# Patient Record
Sex: Male | Born: 1975 | Race: Black or African American | Hispanic: No | Marital: Married | Smoking: Former smoker
Health system: Southern US, Community
[De-identification: ages and names within clinical notes are randomized; demographics above are authoritative.]

## PROBLEM LIST (undated history)

## (undated) DIAGNOSIS — G8929 Other chronic pain: Secondary | ICD-10-CM

## (undated) DIAGNOSIS — M549 Dorsalgia, unspecified: Secondary | ICD-10-CM

## (undated) HISTORY — PX: KNEE SURGERY: SHX244

## (undated) HISTORY — PX: HEMORROIDECTOMY: SUR656

## (undated) HISTORY — PX: SHOULDER SURGERY: SHX246

## (undated) HISTORY — PX: VASECTOMY: SHX75

## (undated) HISTORY — PX: TONSILLECTOMY: SUR1361

## (undated) HISTORY — PX: OTHER SURGICAL HISTORY: SHX169

---

## 2001-12-04 ENCOUNTER — Emergency Department (HOSPITAL_COMMUNITY): Admission: EM | Admit: 2001-12-04 | Discharge: 2001-12-05 | Payer: Self-pay | Admitting: *Deleted

## 2002-02-24 ENCOUNTER — Encounter: Payer: Self-pay | Admitting: Internal Medicine

## 2002-02-24 ENCOUNTER — Emergency Department (HOSPITAL_COMMUNITY): Admission: EM | Admit: 2002-02-24 | Discharge: 2002-02-24 | Payer: Self-pay | Admitting: Internal Medicine

## 2002-09-18 ENCOUNTER — Emergency Department (HOSPITAL_COMMUNITY): Admission: EM | Admit: 2002-09-18 | Discharge: 2002-09-18 | Payer: Self-pay | Admitting: Emergency Medicine

## 2002-09-18 ENCOUNTER — Encounter: Payer: Self-pay | Admitting: Emergency Medicine

## 2003-02-21 ENCOUNTER — Emergency Department (HOSPITAL_COMMUNITY): Admission: EM | Admit: 2003-02-21 | Discharge: 2003-02-21 | Payer: Self-pay | Admitting: Emergency Medicine

## 2003-05-28 ENCOUNTER — Emergency Department (HOSPITAL_COMMUNITY): Admission: EM | Admit: 2003-05-28 | Discharge: 2003-05-28 | Payer: Self-pay | Admitting: Emergency Medicine

## 2003-07-14 ENCOUNTER — Emergency Department (HOSPITAL_COMMUNITY): Admission: EM | Admit: 2003-07-14 | Discharge: 2003-07-14 | Payer: Self-pay | Admitting: Emergency Medicine

## 2003-11-02 ENCOUNTER — Emergency Department (HOSPITAL_COMMUNITY): Admission: EM | Admit: 2003-11-02 | Discharge: 2003-11-02 | Payer: Self-pay | Admitting: Emergency Medicine

## 2003-12-22 ENCOUNTER — Emergency Department (HOSPITAL_COMMUNITY): Admission: EM | Admit: 2003-12-22 | Discharge: 2003-12-23 | Payer: Self-pay | Admitting: *Deleted

## 2004-04-10 ENCOUNTER — Emergency Department (HOSPITAL_COMMUNITY): Admission: EM | Admit: 2004-04-10 | Discharge: 2004-04-10 | Payer: Self-pay | Admitting: Emergency Medicine

## 2004-06-20 ENCOUNTER — Emergency Department (HOSPITAL_COMMUNITY): Admission: EM | Admit: 2004-06-20 | Discharge: 2004-06-20 | Payer: Self-pay | Admitting: Emergency Medicine

## 2004-07-10 ENCOUNTER — Emergency Department (HOSPITAL_COMMUNITY): Admission: EM | Admit: 2004-07-10 | Discharge: 2004-07-10 | Payer: Self-pay | Admitting: Emergency Medicine

## 2004-11-02 ENCOUNTER — Emergency Department (HOSPITAL_COMMUNITY): Admission: EM | Admit: 2004-11-02 | Discharge: 2004-11-02 | Payer: Self-pay | Admitting: Emergency Medicine

## 2004-12-30 ENCOUNTER — Emergency Department (HOSPITAL_COMMUNITY): Admission: EM | Admit: 2004-12-30 | Discharge: 2004-12-30 | Payer: Self-pay | Admitting: Emergency Medicine

## 2005-04-11 ENCOUNTER — Emergency Department (HOSPITAL_COMMUNITY): Admission: EM | Admit: 2005-04-11 | Discharge: 2005-04-11 | Payer: Self-pay | Admitting: Emergency Medicine

## 2005-04-16 ENCOUNTER — Emergency Department (HOSPITAL_COMMUNITY): Admission: EM | Admit: 2005-04-16 | Discharge: 2005-04-17 | Payer: Self-pay | Admitting: Emergency Medicine

## 2005-06-27 ENCOUNTER — Emergency Department (HOSPITAL_COMMUNITY): Admission: EM | Admit: 2005-06-27 | Discharge: 2005-06-27 | Payer: Self-pay | Admitting: Emergency Medicine

## 2005-11-06 ENCOUNTER — Emergency Department (HOSPITAL_COMMUNITY): Admission: EM | Admit: 2005-11-06 | Discharge: 2005-11-06 | Payer: Self-pay | Admitting: Emergency Medicine

## 2006-04-26 ENCOUNTER — Emergency Department (HOSPITAL_COMMUNITY): Admission: EM | Admit: 2006-04-26 | Discharge: 2006-04-26 | Payer: Self-pay | Admitting: Emergency Medicine

## 2006-05-02 ENCOUNTER — Emergency Department (HOSPITAL_COMMUNITY): Admission: EM | Admit: 2006-05-02 | Discharge: 2006-05-02 | Payer: Self-pay | Admitting: Emergency Medicine

## 2006-07-17 ENCOUNTER — Ambulatory Visit: Payer: Self-pay | Admitting: Orthopedic Surgery

## 2006-07-28 ENCOUNTER — Ambulatory Visit (HOSPITAL_COMMUNITY): Admission: RE | Admit: 2006-07-28 | Discharge: 2006-07-28 | Payer: Self-pay | Admitting: Orthopedic Surgery

## 2006-08-09 ENCOUNTER — Ambulatory Visit: Payer: Self-pay | Admitting: Orthopedic Surgery

## 2006-10-12 ENCOUNTER — Ambulatory Visit: Payer: Self-pay | Admitting: Orthopedic Surgery

## 2006-11-04 ENCOUNTER — Emergency Department (HOSPITAL_COMMUNITY): Admission: EM | Admit: 2006-11-04 | Discharge: 2006-11-04 | Payer: Self-pay | Admitting: *Deleted

## 2006-12-04 ENCOUNTER — Ambulatory Visit: Payer: Self-pay | Admitting: Orthopedic Surgery

## 2006-12-11 ENCOUNTER — Ambulatory Visit (HOSPITAL_COMMUNITY): Admission: RE | Admit: 2006-12-11 | Discharge: 2006-12-11 | Payer: Self-pay | Admitting: Orthopedic Surgery

## 2006-12-18 ENCOUNTER — Ambulatory Visit: Payer: Self-pay | Admitting: Orthopedic Surgery

## 2006-12-25 ENCOUNTER — Emergency Department (HOSPITAL_COMMUNITY): Admission: EM | Admit: 2006-12-25 | Discharge: 2006-12-25 | Payer: Self-pay | Admitting: Emergency Medicine

## 2007-01-30 ENCOUNTER — Ambulatory Visit: Payer: Self-pay | Admitting: Orthopedic Surgery

## 2007-01-30 DIAGNOSIS — M75120 Complete rotator cuff tear or rupture of unspecified shoulder, not specified as traumatic: Secondary | ICD-10-CM

## 2007-02-02 ENCOUNTER — Ambulatory Visit (HOSPITAL_COMMUNITY): Admission: RE | Admit: 2007-02-02 | Discharge: 2007-02-02 | Payer: Self-pay | Admitting: Orthopedic Surgery

## 2007-02-02 ENCOUNTER — Ambulatory Visit: Payer: Self-pay | Admitting: Orthopedic Surgery

## 2007-02-06 ENCOUNTER — Ambulatory Visit: Payer: Self-pay | Admitting: Orthopedic Surgery

## 2007-02-12 ENCOUNTER — Ambulatory Visit: Payer: Self-pay | Admitting: Orthopedic Surgery

## 2007-03-09 ENCOUNTER — Encounter: Payer: Self-pay | Admitting: Orthopedic Surgery

## 2007-03-12 ENCOUNTER — Ambulatory Visit: Payer: Self-pay | Admitting: Orthopedic Surgery

## 2007-04-20 ENCOUNTER — Encounter: Payer: Self-pay | Admitting: Orthopedic Surgery

## 2007-04-23 ENCOUNTER — Ambulatory Visit: Payer: Self-pay | Admitting: Orthopedic Surgery

## 2007-06-01 ENCOUNTER — Emergency Department (HOSPITAL_COMMUNITY): Admission: EM | Admit: 2007-06-01 | Discharge: 2007-06-01 | Payer: Self-pay | Admitting: Emergency Medicine

## 2007-08-02 ENCOUNTER — Ambulatory Visit: Payer: Self-pay | Admitting: Orthopedic Surgery

## 2007-09-26 ENCOUNTER — Emergency Department (HOSPITAL_COMMUNITY): Admission: EM | Admit: 2007-09-26 | Discharge: 2007-09-26 | Payer: Self-pay | Admitting: Emergency Medicine

## 2008-01-22 ENCOUNTER — Emergency Department (HOSPITAL_COMMUNITY): Admission: EM | Admit: 2008-01-22 | Discharge: 2008-01-22 | Payer: Self-pay | Admitting: Emergency Medicine

## 2008-03-09 ENCOUNTER — Emergency Department (HOSPITAL_COMMUNITY): Admission: EM | Admit: 2008-03-09 | Discharge: 2008-03-09 | Payer: Self-pay | Admitting: Emergency Medicine

## 2008-03-10 ENCOUNTER — Ambulatory Visit (HOSPITAL_COMMUNITY): Admission: RE | Admit: 2008-03-10 | Discharge: 2008-03-10 | Payer: Self-pay | Admitting: Emergency Medicine

## 2008-06-29 ENCOUNTER — Emergency Department (HOSPITAL_COMMUNITY): Admission: EM | Admit: 2008-06-29 | Discharge: 2008-06-29 | Payer: Self-pay | Admitting: Emergency Medicine

## 2008-09-06 ENCOUNTER — Emergency Department (HOSPITAL_COMMUNITY): Admission: EM | Admit: 2008-09-06 | Discharge: 2008-09-06 | Payer: Self-pay | Admitting: Emergency Medicine

## 2009-02-21 ENCOUNTER — Emergency Department (HOSPITAL_COMMUNITY): Admission: EM | Admit: 2009-02-21 | Discharge: 2009-02-21 | Payer: Self-pay | Admitting: Emergency Medicine

## 2009-04-23 ENCOUNTER — Emergency Department (HOSPITAL_COMMUNITY): Admission: EM | Admit: 2009-04-23 | Discharge: 2009-04-23 | Payer: Self-pay | Admitting: Emergency Medicine

## 2009-09-10 ENCOUNTER — Emergency Department (HOSPITAL_COMMUNITY): Admission: EM | Admit: 2009-09-10 | Discharge: 2009-09-10 | Payer: Self-pay | Admitting: Emergency Medicine

## 2009-12-02 ENCOUNTER — Encounter: Payer: Self-pay | Admitting: Orthopedic Surgery

## 2009-12-14 ENCOUNTER — Ambulatory Visit: Payer: Self-pay | Admitting: Orthopedic Surgery

## 2009-12-14 DIAGNOSIS — S43439A Superior glenoid labrum lesion of unspecified shoulder, initial encounter: Secondary | ICD-10-CM

## 2009-12-15 ENCOUNTER — Encounter (INDEPENDENT_AMBULATORY_CARE_PROVIDER_SITE_OTHER): Payer: Self-pay | Admitting: *Deleted

## 2009-12-17 ENCOUNTER — Ambulatory Visit (HOSPITAL_COMMUNITY): Admission: RE | Admit: 2009-12-17 | Discharge: 2009-12-17 | Payer: Self-pay | Admitting: Orthopedic Surgery

## 2009-12-24 ENCOUNTER — Ambulatory Visit: Payer: Self-pay | Admitting: Orthopedic Surgery

## 2010-06-01 NOTE — Miscellaneous (Signed)
Summary: mri appt [facility] 12/17/09 reg at 930am  Clinical Lists Changes    precert for [employer] [id number] expires 30 days from 12/15/09, lmom for pt to call here to schedule appt for mri results, to bring disc for fu appt  12/16/09 Left fol/up voice message for pt to return call to confirm MRI appt + to schedule fol up appt.  Cammie Sickle             Per Ivette Loyal, patient called back, confirmed and scheduled fol/up appt.

## 2010-06-01 NOTE — Letter (Signed)
Summary: History form  History form   Imported By: Jacklynn Ganong 12/15/2009 11:44:38  _____________________________________________________________________  External Attachment:    Type:   Image     Comment:   External Document

## 2010-06-01 NOTE — Assessment & Plan Note (Signed)
Summary: left shoulder bringing xrays/bcbs.cbt   Vital Signs:  Patient profile:   35 year old male Height:      70 inches Weight:      292 pounds Pulse rate:   80 / minute Resp:     16 per minute  Vitals Entered By: Fuller Canada MD (December 14, 2009 9:03 AM)  Visit Type:  new problem Referring Provider:  self Primary Provider:  Dr. Kristian Covey  CC:  left shoulder pain.  History of Present Illness: I saw George Gallagher in the office today for a visit.  He is a 35 years old man with the complaint of:  left shoulder pain.  Injury 11/16/09.  Xrays brought from [facility] from 12/02/09.  Meds: Advil 400 two times a day.  High patient today was bench pressing on the incline bench with a lot of weight he struggled to push the weight up he lost control of it when his shoulder locked up and then he had pain near the anterior deltoid.  Since that time he is noting catching and locking in the LEFT shoulder with throbbing and burning pain which is 10 out of 10 and constant.  Some of his range of motion initially was lost and has now come back but is not full.  He is tried Naprosyn no relief and Advil 40 mg twice a day with mild to moderate relief.  He is having trouble sleeping and any motion seems to make it more painful    Allergies (verified): No Known Drug Allergies  Past History:  Past Surgical History: left knee acl  tonsils boil removed from neck vasectomy rt shoulder  Family History: na  Social History: Patient is married.  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  Review of Systems Musculoskeletal:  Complains of joint pain, swelling, instability, stiffness, redness, heat, and muscle pain.  The review of systems is negative for Constitutional, Cardiovascular, Respiratory, Gastrointestinal, Genitourinary, Neurologic, Endocrine, Psychiatric, Skin, HEENT, Immunology, and Hemoatologic.  Physical  Exam  Additional Exam:  vital signs are stable she recorded data  His appearance he is a large male well-developed well-nourished woman hygiene normal  He's oriented x3  His mood is pleasant his affect is flat  His gait and station are normal  As normal pulse and temperature in the LEFT upper extremity with no lymphadenopathy in the cervical or axillary areas.  He has normal sensation.  No pathologic reflexes.  His coordination and balance are normal.  He does have painful range of motion painful abduction external rotation his strength appears normal his skin is intact he has no palpable tenderness posteriorly or laterally but does have tenderness anteriorly  Positive speed test and negative Yergason test and a negative impingement test.     Impression & Recommendations:  Problem # 1:  SUPERIOR GLENOID LABRUM TEAR (ICD-840.7) Assessment New  the x-rays were done at [facility] 3 views of the shoulder were normal.  Impression labral tear addendum and a positive crank test.  Recommend MRI LEFT shoulder is normal then treated for impingement.  Orders: Est. Patient Level IV (40981)  Patient Instructions: 1)  M

## 2010-06-01 NOTE — Assessment & Plan Note (Signed)
Summary: MRI RESULTS BRINGING DISC/BSF   Visit Type:  Follow-up Referring Provider:  self Primary Provider:  Dr. Kristian Covey  CC:  left shoulder pain.  History of Present Illness: 35 year old male was bench pressing on an incline bench he struggled to push the weight up lost control of it and then had pain in his shoulder.  He had symptoms of a cuff tear and was sent for an MRI which shows that he does have a rotator cuff tear of the LEFT shoulder.  I recommended surgery he would like to try injection.  I gave him a injection in the subacromial space of LEFT shoulder.  IMPRESSION:   1.  Full-thickness partial-width anterior supraspinatus tear. 2.  Partial thickness articular surface rim-rent tear of the posterior portion of the supraspinatus. 3.  Mild subscapularis tendinopathy.     Allergies: No Known Drug Allergies  Review of Systems Neurologic:  Denies numbness and tingling. Musculoskeletal:  Complains of joint pain and muscle pain.   Impression & Recommendations:  Problem # 1:  RUPTURE ROTATOR CUFF (ICD-727.61) Assessment Unchanged  subacromial space injection LEFT shoulder  Review MRI  Technique for shoulder injection Verbal consent obtained/The shoulder was injected with depomedrol 40mg /cc and sensorcaine .25% . There were no complications    I think the patient will need surgery.  He has opted for nonsurgical treatment at this time but we will reevaluate  Orders: Est. Patient Level III ([address]) Joint Aspirate / Injection, Large (20610) Depo- Medrol 40mg  (J1030)  Medications Added to Medication List This Visit: 1)  Tramadol Hcl 50 Mg Tabs (Tramadol hcl) .Marland Kitchen.. 1 q 4 as needed pain  Patient Instructions: 1)  You have received an injection of cortisone today. You may experience increased pain at the injection site. Apply ice pack to the area for 20 minutes every 2 hours and take 2 xtra strength tylenol every 8 hours. This increased pain will usually resolve  in 24 hours. The injection will take effect in 3-10 days.  2)  2 weeks  Prescriptions: TRAMADOL HCL 50 MG TABS (TRAMADOL HCL) 1 q 4 as needed pain  #84 x 0   Entered and Authorized by:   Fuller Canada MD   Signed by:   Fuller Canada MD on 12/24/2009   Method used:   Print then Give to Patient   RxID:   [id number]

## 2010-06-01 NOTE — Letter (Signed)
Summary: Out of Work  [facility]  [address]  [place], [place] [address]   Phone: [phone]  Fax: [phone]    December 14, 2009   Employee:  George Gallagher    To Whom It May Concern:   For Medical reasons, please excuse the above named employee from work for the following dates:  Start:   12/14/09  End/Return to work as scheduled, full duty:   12/15/09  If you need additional information, please feel free to contact our office.         Sincerely,    Terrance Mass, MD

## 2010-06-01 NOTE — Letter (Signed)
Summary: Out of Work  [facility]  [address]  [place], [place] [address]   Phone: [phone]  Fax: [phone]    December 24, 2009   Employee:  George Gallagher    To Whom It May Concern:   For Medical reasons, please excuse the above named employee from work for the following dates:  Start:   12/24/09  End:   12/26/09  Return to full duty work:  12/27/09   If you need additional information, please feel free to contact our office.         Sincerely,    Terrance Mass, MD

## 2010-09-14 NOTE — Op Note (Signed)
NAME:  George Gallagher, George Gallagher              ACCOUNT NO.:  [id number]   MEDICAL RECORD NO.:  [id number]          PATIENT TYPE:  AMB   LOCATION:  DAY                           FACILITY:  [facility]   PHYSICIAN:  Vickki Hearing, M.D.DATE OF BIRTH:  [birth year]   DATE OF PROCEDURE:  02/02/2007  DATE OF DISCHARGE:                               OPERATIVE REPORT   HISTORY:  George Gallagher is a 35 year old male who presented with right  shoulder pain.  He was treated with nonoperative measures including anti-  inflammatories and injections. He eventually had an MRI which showed he  had a torn right rotator cuff primarily involving the supraspinatus  tendon.  The joint labrum was normal.   PREOPERATIVE DIAGNOSIS:  Torn rotator cuff, right shoulder.   POSTOPERATIVE DIAGNOSIS:  Torn rotator cuff, right shoulder.   PROCEDURE:  Arthroscopy, right shoulder, with debridement of  glenohumeral joint, subacromial decompression, and mini-open right  rotator cuff repair.   SURGEON:  Vickki Hearing, M.D.   ASSISTANT:  None.   ANESTHESIA:  General.   OPERATIVE FINDINGS:  In the glenohumeral joint, we found degeneration of  the biceps anchor with fraying of the labrum.  The supraspinatus tendon  tear was visualized from the joint.  In the subacromial space, the tear  was a crescent shaped tear approximately 1.5 cm in length and with about  5 mm of retraction.  There was no significant acromial spurring.  There  were no specimens.   ESTIMATED BLOOD LOSS:  Minimal.   COMPLICATIONS:  None.   COUNTS:  Correct.   DISPOSITION:  The patient's condition at the end of the procedure was  excellent.   DESCRIPTION OF PROCEDURE:  The patient was identified as George Gallagher.  The right shoulder was marked as the surgical site and countersigned.  The history and physical was updated.  The patient was taken to the  operating room where he had general anesthesia.  A brief exam under  anesthesia revealed no  significant findings and the right shoulder was  prepped and draped with DuraPrep and sterile draping technique.  The  time out procedure was completed confirming the procedure as a shoulder  scope and rotator cuff repair on [facility] involving his right  shoulder.   After confirming that the antibiotics had been given during the time out  procedure, we made a posterior portal and performed a diagnostic  arthroscopy from the posterior approach.  There was mild sinusitis in  the joint, some degeneration and fraying of the labrum and the biceps  tendon.  This was debrided through an anterior portal which was  established just above the subscapularis. Intra-articular findings  showed that the anterior inferior glenohumeral ligament tightened well  in abduction and external rotation.  We then placed the scope in the  subacromial space, did a brief debridement and a mild acromioplasty from  a lateral portal.  The rotator cuff tear was found and then, after some  gentle debridement, the cortical tissue was scored with a bur and a  suture anchor was placed with the dead man's angle.  A mini-open approach was performed and two sutures from the corkscrew  anchor were passed through the cuff with a mattress technique and one #2  Ethibond suture was used to complete the repair. This gave a watertight  closure.  The wound was irrigated and closed with 0 Monocryl and  staples.  The portal sites were closed with 3-0 nylon.  The subcu tissue  was injected with 30 mL of Marcaine with epinephrine.  The patient was  placed in a Cryo/cuff which was activated, he was then extubated and  taken to the recovery room in good condition.   POSTOPERATIVE PLAN:  The patient will follow up with me early next week.  I have given him the following medications; Lorcet Plus one q.4h. p.r.n.  pain, number 90 with three refills, 800 mg ibuprofen one q.9h., number  90 with three refills, Phenergan 25 mg 1 q.6h.,  number 40, Flexeril 5 mg  q.8h., number 30, one refill.   This patient will be on an advanced rotator cuff protocol with physical  therapy to start next week.  He can begin immediate range of motion  isometrics for the deltoid at six weeks. After 6 weeks, can have passive  resistance exercises until 12 weeks, and then progressive strengthening  of the rotator cuff with isometrics and isotonic exercises.      Vickki Hearing, M.D.  Electronically Signed     SEH/MEDQ  D:  02/02/2007  T:  02/03/2007  Job:  [id number]

## 2011-01-26 LAB — COMPREHENSIVE METABOLIC PANEL
ALT: 24
Alkaline Phosphatase: 61
BUN: 8
GFR calc Af Amer: >60
GFR calc non Af Amer: >60
Glucose, Bld: 86
Total Bilirubin: 0.7
Total Protein: 6.7

## 2011-01-26 LAB — CBC
HCT: 40
Hemoglobin: 13.9
MCHC: 34.6
Platelets: 268
RBC: 4.59
RDW: 13
WBC: 5.9

## 2011-01-26 LAB — URINALYSIS, ROUTINE W REFLEX MICROSCOPIC
Hgb urine dipstick: NEGATIVE
Nitrite: NEGATIVE
Protein, ur: NEGATIVE
Specific Gravity, Urine: 1.02
pH: 5.5

## 2011-01-26 LAB — INFLUENZA A+B VIRUS AG-DIRECT(RAPID): Inflenza A Ag: NEGATIVE

## 2011-01-26 LAB — DIFFERENTIAL
Basophils Absolute: 0
Eosinophils Relative: 4
Monocytes Absolute: 0.5
Monocytes Relative: 9
Neutro Abs: 2.8

## 2011-02-10 LAB — HEMOGLOBIN AND HEMATOCRIT, BLOOD: HCT: 41.6

## 2011-02-15 LAB — URINALYSIS, ROUTINE W REFLEX MICROSCOPIC
Ketones, ur: NEGATIVE
Urobilinogen, UA: 0.2
pH: 6.5

## 2011-02-15 LAB — DIFFERENTIAL
Basophils Absolute: 0
Basophils Relative: 0
Eosinophils Relative: 7 — ABNORMAL HIGH
Lymphocytes Relative: 35
Lymphs Abs: 2
Neutrophils Relative %: 48

## 2011-02-15 LAB — BASIC METABOLIC PANEL
BUN: 10
CO2: 28
Calcium: 8.7
Chloride: 108
Creatinine, Ser: 0.76
GFR calc Af Amer: >60
Glucose, Bld: 85

## 2011-02-15 LAB — CBC
HCT: 41.3
Hemoglobin: 14.1
RBC: 4.68
RDW: 12.9

## 2011-07-09 ENCOUNTER — Emergency Department (HOSPITAL_COMMUNITY)
Admission: EM | Admit: 2011-07-09 | Discharge: 2011-07-09 | Disposition: A | Discharge status: Home / Self Care | Payer: BC Managed Care – PPO | Attending: Emergency Medicine | Admitting: Emergency Medicine

## 2011-07-09 ENCOUNTER — Encounter (HOSPITAL_COMMUNITY): Payer: Self-pay | Admitting: *Deleted

## 2011-07-09 ENCOUNTER — Emergency Department (HOSPITAL_COMMUNITY): Payer: BC Managed Care – PPO

## 2011-07-09 DIAGNOSIS — R0789 Other chest pain: Secondary | ICD-10-CM

## 2011-07-09 DIAGNOSIS — R071 Chest pain on breathing: Secondary | ICD-10-CM | POA: Insufficient documentation

## 2011-07-09 MED ORDER — OXYCODONE-ACETAMINOPHEN 5-325 MG PO TABS
1.0000 | ORAL_TABLET | Freq: Once | ORAL | Status: AC
  Administered 2011-07-09: 1 via ORAL
  Filled 2011-07-09: qty 1

## 2011-07-09 MED ORDER — OXYCODONE-ACETAMINOPHEN 5-325 MG PO TABS: 2.0000 | ORAL_TABLET | ORAL | Status: AC | PRN

## 2011-07-09 NOTE — ED Notes (Signed)
Pt states dropped a bar weight on his chest thurs. Now increased pain w/ movement & coughing. No brusing noted, no abmormal chest movement.

## 2011-07-09 NOTE — ED Notes (Signed)
Patient with no complaints at this time. Respirations even and unlabored. Skin warm/dry. Discharge instructions reviewed with patient at this time. Patient given opportunity to voice concerns/ask questions. Patient discharged at this time and left Emergency Department with steady gait.   

## 2011-07-09 NOTE — ED Provider Notes (Signed)
History     CSN: [id number]  Arrival date & time 07/09/11  4098   First MD Initiated Contact with Patient 07/09/11 [phone]      Chief Complaint  Patient presents with  . Chest Pain    (Consider location/radiation/quality/duration/timing/severity/associated sxs/prior treatment) Patient is a 36 y.o. male presenting with chest pain. The history is provided by the patient and the spouse.  Chest Pain Pertinent negatives for primary symptoms include no shortness of breath, no cough, no abdominal pain, no nausea and no vomiting.   the patient is a 36 year old male, with no significant past medical history, who presents to emergency department complaining of left-sided chest pain.  His pain has been present since he dropped 136 pound weight on his chest.  A few days ago while he was weight lifting.  He denies shortness of breath.  He denies nausea, vomiting, or any other symptoms.  History reviewed. No pertinent past medical history.  Past Surgical History  Procedure Date  . Tonsillectomy   . Shoulder surgery   . Knee surgery     No family history on file.  History  Substance Use Topics  . Smoking status: Never Smoker   . Smokeless tobacco: Not on file  . Alcohol Use: Yes     occ      Review of Systems  Respiratory: Negative for cough and shortness of breath.   Cardiovascular: Positive for chest pain.  Gastrointestinal: Negative for nausea, vomiting and abdominal pain.  All other systems reviewed and are negative.    Allergies  Review of patient's allergies indicates no known allergies.  Home Medications  No current outpatient prescriptions on file.  BP 112/66  Pulse 75  Temp 98 F (36.7 C)  Resp 20  Ht 5\' 9"  (1.753 m)  Wt 292 lb (132.45 kg)  BMI 43.12 kg/m2  SpO2 97%  Physical Exam  Vitals reviewed. Constitutional: He is oriented to person, place, and time. He appears well-developed and well-nourished. No distress.  HENT:  Head: Normocephalic and atraumatic.   Eyes: Conjunctivae are normal. Pupils are equal, round, and reactive to light.  Neck: Normal range of motion. Neck supple.  Cardiovascular: Normal rate.   No murmur heard. Pulmonary/Chest: Effort normal and breath sounds normal. He exhibits tenderness.       No contusion or crepitus  Abdominal: Soft. There is no tenderness.  Musculoskeletal: Normal range of motion.  Neurological: He is alert and oriented to person, place, and time.  Skin: Skin is warm and dry.       No chest wall contusion  Psychiatric: He has a normal mood and affect.    ED Course  Procedures (including critical care time) Chest wall pain after dropping weights on his chest.  Several days ago.  Lungs are clear and equal bilaterally.  We will treat him with Percocet and perform a chest x-ray, to look for rib fractures or pulmonary contusions.  Labs Reviewed - No data to display No results found.   No diagnosis found.    MDM  Chest wall pain        Cheri Guppy, MD 07/09/11 [phone]

## 2011-07-09 NOTE — ED Notes (Signed)
Pt was lifting weights and dropped 135lbs on his chest on Thurs

## 2012-06-27 ENCOUNTER — Encounter (HOSPITAL_COMMUNITY): Payer: Self-pay

## 2012-06-27 ENCOUNTER — Emergency Department (HOSPITAL_COMMUNITY)
Admission: EM | Admit: 2012-06-27 | Discharge: 2012-06-27 | Disposition: A | Discharge status: Home / Self Care | Payer: BC Managed Care – PPO | Attending: Emergency Medicine | Admitting: Emergency Medicine

## 2012-06-27 DIAGNOSIS — M549 Dorsalgia, unspecified: Secondary | ICD-10-CM

## 2012-06-27 DIAGNOSIS — Y9389 Activity, other specified: Secondary | ICD-10-CM | POA: Insufficient documentation

## 2012-06-27 DIAGNOSIS — Y929 Unspecified place or not applicable: Secondary | ICD-10-CM | POA: Insufficient documentation

## 2012-06-27 DIAGNOSIS — X503XXA Overexertion from repetitive movements, initial encounter: Secondary | ICD-10-CM | POA: Insufficient documentation

## 2012-06-27 DIAGNOSIS — Z8739 Personal history of other diseases of the musculoskeletal system and connective tissue: Secondary | ICD-10-CM | POA: Insufficient documentation

## 2012-06-27 DIAGNOSIS — IMO0002 Reserved for concepts with insufficient information to code with codable children: Secondary | ICD-10-CM | POA: Insufficient documentation

## 2012-06-27 MED ORDER — METHOCARBAMOL 500 MG PO TABS: 500.0000 mg | ORAL_TABLET | Freq: Three times a day (TID) | ORAL | Status: DC

## 2012-06-27 MED ORDER — HYDROCODONE-ACETAMINOPHEN 7.5-325 MG PO TABS: 1.0000 | ORAL_TABLET | ORAL | Status: DC | PRN

## 2012-06-27 MED ORDER — DEXAMETHASONE 6 MG PO TABS: ORAL_TABLET | ORAL | Status: DC

## 2012-06-27 MED ORDER — DEXAMETHASONE SODIUM PHOSPHATE 4 MG/ML IJ SOLN
8.0000 mg | Freq: Once | INTRAMUSCULAR | Status: AC
  Administered 2012-06-27: 8 mg via INTRAMUSCULAR
  Filled 2012-06-27: qty 2

## 2012-06-27 MED ORDER — ONDANSETRON HCL 4 MG PO TABS
4.0000 mg | ORAL_TABLET | Freq: Once | ORAL | Status: AC
  Administered 2012-06-27: 4 mg via ORAL
  Filled 2012-06-27: qty 1

## 2012-06-27 MED ORDER — KETOROLAC TROMETHAMINE 60 MG/2ML IM SOLN
60.0000 mg | Freq: Once | INTRAMUSCULAR | Status: AC
  Administered 2012-06-27: 60 mg via INTRAMUSCULAR
  Filled 2012-06-27: qty 2

## 2012-06-27 NOTE — ED Provider Notes (Signed)
History     CSN: [id number]  Arrival date & time 06/27/12  1029   First MD Initiated Contact with Patient 06/27/12 1035      Chief Complaint  Patient presents with  . Back Pain    (Consider location/radiation/quality/duration/timing/severity/associated sxs/prior treatment) Patient is a 37 y.o. male presenting with back pain. The history is provided by the patient.  Back Pain Location:  Lumbar spine Quality:  Aching Radiates to:  Does not radiate Pain severity:  Moderate Pain is:  Same all the time Onset quality:  Gradual Timing:  Intermittent Progression:  Worsening Chronicity: new on chronic. Context comment:  Driving a truck. Relieved by:  Bed rest Worsened by:  Standing, twisting and movement Ineffective treatments:  Bed rest and being still Associated symptoms: no abdominal pain, no bladder incontinence, no bowel incontinence, no chest pain, no dysuria and no perianal numbness   Risk factors comment:  Hx of DDD of the Lumbar spine.   History reviewed. No pertinent past medical history.  Past Surgical History  Procedure Laterality Date  . Tonsillectomy    . Shoulder surgery    . Knee surgery      No family history on file.  History  Substance Use Topics  . Smoking status: Never Smoker   . Smokeless tobacco: Not on file  . Alcohol Use: Yes     Comment: occ      Review of Systems  Constitutional: Negative for activity change.       All ROS Neg except as noted in HPI  HENT: Negative for nosebleeds and neck pain.   Eyes: Negative for photophobia and discharge.  Respiratory: Negative for cough, shortness of breath and wheezing.   Cardiovascular: Negative for chest pain and palpitations.  Gastrointestinal: Negative for abdominal pain, blood in stool and bowel incontinence.  Genitourinary: Negative for bladder incontinence, dysuria, frequency and hematuria.  Musculoskeletal: Positive for back pain and arthralgias.  Skin: Negative.   Neurological: Negative  for dizziness, seizures and speech difficulty.  Psychiatric/Behavioral: Negative for hallucinations and confusion.    Allergies  Review of patient's allergies indicates no known allergies.  Home Medications  No current outpatient prescriptions on file.  BP 130/78  Pulse 66  Temp(Src) 97.7 F (36.5 C) (Oral)  Resp 18  Ht 5\' 9"  (1.753 m)  Wt 288 lb (130.636 kg)  BMI 42.51 kg/m2  SpO2 100%  Physical Exam  Nursing note and vitals reviewed. Constitutional: He is oriented to person, place, and time. He appears well-developed and well-nourished.  Non-toxic appearance.  HENT:  Head: Normocephalic.  Right Ear: Tympanic membrane and external ear normal.  Left Ear: Tympanic membrane and external ear normal.  Eyes: EOM and lids are normal. Pupils are equal, round, and reactive to light.  Neck: Normal range of motion. Neck supple. Carotid bruit is not present.  Cardiovascular: Normal rate, regular rhythm, normal heart sounds, intact distal pulses and normal pulses.   Pulmonary/Chest: Breath sounds normal. No respiratory distress.  Abdominal: Soft. Bowel sounds are normal. There is no tenderness. There is no guarding.  Musculoskeletal:       Lumbar back: He exhibits decreased range of motion, tenderness and pain. He exhibits no swelling and no deformity.  Lymphadenopathy:       Head (right side): No submandibular adenopathy present.       Head (left side): No submandibular adenopathy present.    He has no cervical adenopathy.  Neurological: He is alert and oriented to person, place, and time. He  has normal strength. No cranial nerve deficit or sensory deficit.  Skin: Skin is warm and dry.  Psychiatric: He has a normal mood and affect. His speech is normal.    ED Course  Procedures (including critical care time)  Labs Reviewed - No data to display No results found.   No diagnosis found.    MDM  I have reviewed nursing notes, vital signs, and all appropriate lab and imaging  results for this patient. Pt has hx of DDD of the L spine. He recently re-injured the lower back while bending and winding up a trailer. No gross neuro deficits on today's exam. Pain to palption of the lower lumbar area.  Plan: pt to see his primary MD on 2/28. Rx for decadron, robaxin and norco given to the pt.       Kathie Dike, [place] 06/27/12 1123

## 2012-06-27 NOTE — ED Notes (Signed)
Pt reports has history of back pain.  States is a [employer] and thinks he aggravated his back pain after bending over to wind his trailer up.

## 2012-06-27 NOTE — ED Provider Notes (Signed)
Medical screening examination/treatment/procedure(s) were performed by non-physician practitioner and as supervising physician I was immediately available for consultation/collaboration.    Roland Lipke D Madasyn Heath, MD 06/27/12 1546 

## 2012-06-27 NOTE — ED Notes (Signed)
Pt c/o lower back pain that began early this morning. Pt states he has hx of back pain and usually sees a Land. Pt was unable to get appointment today and states pain is too bad to wait.

## 2012-12-16 ENCOUNTER — Encounter (HOSPITAL_COMMUNITY): Payer: Self-pay | Admitting: Emergency Medicine

## 2012-12-16 ENCOUNTER — Emergency Department (HOSPITAL_COMMUNITY)
Admission: EM | Admit: 2012-12-16 | Discharge: 2012-12-16 | Disposition: A | Discharge status: Home / Self Care | Payer: BC Managed Care – PPO | Attending: Emergency Medicine | Admitting: Emergency Medicine

## 2012-12-16 DIAGNOSIS — M549 Dorsalgia, unspecified: Secondary | ICD-10-CM

## 2012-12-16 DIAGNOSIS — M62830 Muscle spasm of back: Secondary | ICD-10-CM

## 2012-12-16 DIAGNOSIS — X500XXA Overexertion from strenuous movement or load, initial encounter: Secondary | ICD-10-CM | POA: Insufficient documentation

## 2012-12-16 DIAGNOSIS — Y929 Unspecified place or not applicable: Secondary | ICD-10-CM | POA: Insufficient documentation

## 2012-12-16 DIAGNOSIS — Y9389 Activity, other specified: Secondary | ICD-10-CM | POA: Insufficient documentation

## 2012-12-16 DIAGNOSIS — Z79899 Other long term (current) drug therapy: Secondary | ICD-10-CM | POA: Insufficient documentation

## 2012-12-16 DIAGNOSIS — IMO0002 Reserved for concepts with insufficient information to code with codable children: Secondary | ICD-10-CM | POA: Insufficient documentation

## 2012-12-16 HISTORY — DX: Other chronic pain: G89.29

## 2012-12-16 HISTORY — DX: Dorsalgia, unspecified: M54.9

## 2012-12-16 MED ORDER — DIAZEPAM 5 MG PO TABS
5.0000 mg | ORAL_TABLET | Freq: Once | ORAL | Status: AC
  Administered 2012-12-16: 5 mg via ORAL
  Filled 2012-12-16: qty 1

## 2012-12-16 MED ORDER — OXYCODONE-ACETAMINOPHEN 5-325 MG PO TABS: 1.0000 | ORAL_TABLET | ORAL | Status: DC | PRN

## 2012-12-16 MED ORDER — CYCLOBENZAPRINE HCL 10 MG PO TABS: 10.0000 mg | ORAL_TABLET | Freq: Three times a day (TID) | ORAL | Status: DC | PRN

## 2012-12-16 MED ORDER — DICLOFENAC SODIUM 75 MG PO TBEC: 75.0000 mg | DELAYED_RELEASE_TABLET | Freq: Two times a day (BID) | ORAL | Status: DC

## 2012-12-16 MED ORDER — KETOROLAC TROMETHAMINE 60 MG/2ML IM SOLN
60.0000 mg | Freq: Once | INTRAMUSCULAR | Status: AC
  Administered 2012-12-16: 60 mg via INTRAMUSCULAR
  Filled 2012-12-16: qty 2

## 2012-12-16 NOTE — ED Notes (Signed)
Pt states back "goes out" every so often. 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 Pt slow with movement. Has been taking ibuprofen and tylenol with no relief. Rating pain 10. Last episode was in February

## 2012-12-16 NOTE — ED Provider Notes (Signed)
CSN: [id number]     Arrival date & time 12/16/12  4098 History     First MD Initiated Contact with Patient 12/16/12 0815     Chief Complaint  Patient presents with  . Back Pain   (Consider location/radiation/quality/duration/timing/severity/associated sxs/prior Treatment) Patient is a 37 y.o. male presenting with back pain. The history is provided by the patient.  Back Pain Location:  Lumbar spine Quality:  Aching and burning Radiates to: bilateral buttocks, left > right. Pain severity:  Moderate Pain is:  Same all the time Onset quality:  Sudden Duration:  2 days Timing:  Constant Progression:  Unchanged Chronicity:  Recurrent Context: recent injury and twisting   Context comment:  Bending over washing his car and felt a "pop" in his lower back.  pain when he tries to stand or walk Relieved by:  Nothing Worsened by:  Standing, twisting and bending Ineffective treatments:  Ibuprofen and OTC medications Associated symptoms: no abdominal pain, no abdominal swelling, no bladder incontinence, no bowel incontinence, no chest pain, no dysuria, no fever, no headaches, no leg pain, no numbness, no paresthesias, no pelvic pain, no perianal numbness, no tingling and no weakness     Past Medical History  Diagnosis Date  . Chronic back pain    Past Surgical History  Procedure Laterality Date  . Tonsillectomy    . Shoulder surgery    . Knee surgery     History reviewed. No pertinent family history. History  Substance Use Topics  . Smoking status: Never Smoker   . Smokeless tobacco: Not on file  . Alcohol Use: Yes     Comment: occ    Review of Systems  Constitutional: Negative for fever.  Respiratory: Negative for shortness of breath.   Cardiovascular: Negative for chest pain.  Gastrointestinal: Negative for vomiting, abdominal pain, constipation and bowel incontinence.  Genitourinary: Negative for bladder incontinence, dysuria, hematuria, flank pain, decreased urine volume,  difficulty urinating and pelvic pain.       No perineal numbness or incontinence of urine or feces  Musculoskeletal: Positive for back pain. Negative for joint swelling.  Skin: Negative for rash.  Neurological: Negative for tingling, weakness, numbness, headaches and paresthesias.  All other systems reviewed and are negative.    Allergies  Review of patient's allergies indicates no known allergies.  Home Medications   Current Outpatient Rx  Name  Route  Sig  Dispense  Refill  . acetaminophen (TYLENOL) 500 MG tablet   Oral   Take 1,000 mg by mouth every 6 (six) hours as needed for pain.         Marland Kitchen dexamethasone (DECADRON) 6 MG tablet      1 po daily with food   7 tablet   0   . HYDROcodone-acetaminophen (NORCO) 7.5-325 MG per tablet   Oral   Take 1 tablet by mouth every 4 (four) hours as needed for pain.   20 tablet   0   . ibuprofen (ADVIL,MOTRIN) 200 MG tablet   Oral   Take 400-800 mg by mouth every 6 (six) hours as needed for pain.         . methocarbamol (ROBAXIN) 500 MG tablet   Oral   Take 1 tablet (500 mg total) by mouth 3 (three) times daily.   21 tablet   0    BP 121/72  Pulse 82  Temp(Src) 98.1 F (36.7 C) (Oral)  Resp 19  SpO2 94% Physical Exam  Nursing note and vitals reviewed. Constitutional: He  is oriented to person, place, and time. He appears well-developed and well-nourished. No distress.  HENT:  Head: Normocephalic and atraumatic.  Neck: Normal range of motion. Neck supple.  Cardiovascular: Normal rate, regular rhythm, normal heart sounds and intact distal pulses.   No murmur heard. Pulmonary/Chest: Effort normal and breath sounds normal. No respiratory distress.  Abdominal: Soft. He exhibits no distension. There is no tenderness. There is no rebound and no guarding.  Musculoskeletal: He exhibits tenderness. He exhibits no edema.       Lumbar back: He exhibits tenderness and pain. He exhibits normal range of motion, no swelling, no  deformity, no laceration and normal pulse.  ttp of the bilateral lumbar paraspinal muscles.  No spinal tenderness.  DP pulses are brisk and symmetrical.  Distal sensation intact.  Hip Flexors/Extensors are intact.  Pain reproduced with SLR on left  Neurological: He is alert and oriented to person, place, and time. No cranial nerve deficit or sensory deficit. He exhibits normal muscle tone. Coordination and gait normal.  Reflex Scores:      Patellar reflexes are 2+ on the right side and 2+ on the left side.      Achilles reflexes are 2+ on the right side and 2+ on the left side. Skin: Skin is warm and dry.    ED Course   Procedures (including critical care time)  Labs Reviewed - No data to display No results found. No diagnosis found.  MDM     Previous ED chart reviewed.  Pt viewed on the [place] narcotic database.  No recent prescriptions since June.  Patient has ttp of the lumbar paraspinal muscles.  No focal neuro deficits on exam.  Ambulates with a slow but steady gait.  No concerning sx's for emergent neuro or infectious process. No hx of trauma, imaging not indicated at this time.  Likely muscle strain/spasm.  Pt agrees to rest, ice , heat and close f/u with his PMD.    VSS.  He appears stable for d/c     Venus Gilles L. Trisha Mangle, PA-C 12/16/12 [phone]

## 2012-12-16 NOTE — ED Provider Notes (Signed)
Medical screening examination/treatment/procedure(s) were performed by non-physician practitioner and as supervising physician I was immediately available for consultation/collaboration.  Derwood Kaplan, MD 12/16/12 [phone]

## 2013-10-26 ENCOUNTER — Encounter (HOSPITAL_COMMUNITY): Payer: Self-pay | Admitting: Emergency Medicine

## 2013-10-26 ENCOUNTER — Emergency Department (HOSPITAL_COMMUNITY)
Admission: EM | Admit: 2013-10-26 | Discharge: 2013-10-26 | Disposition: A | Discharge status: Home / Self Care | Payer: BC Managed Care – PPO | Attending: Emergency Medicine | Admitting: Emergency Medicine

## 2013-10-26 ENCOUNTER — Emergency Department (HOSPITAL_COMMUNITY): Payer: BC Managed Care – PPO

## 2013-10-26 DIAGNOSIS — M25561 Pain in right knee: Secondary | ICD-10-CM

## 2013-10-26 DIAGNOSIS — Z791 Long term (current) use of non-steroidal anti-inflammatories (NSAID): Secondary | ICD-10-CM | POA: Insufficient documentation

## 2013-10-26 DIAGNOSIS — S63502A Unspecified sprain of left wrist, initial encounter: Secondary | ICD-10-CM

## 2013-10-26 DIAGNOSIS — Y929 Unspecified place or not applicable: Secondary | ICD-10-CM | POA: Insufficient documentation

## 2013-10-26 DIAGNOSIS — M25569 Pain in unspecified knee: Secondary | ICD-10-CM | POA: Insufficient documentation

## 2013-10-26 DIAGNOSIS — Z79899 Other long term (current) drug therapy: Secondary | ICD-10-CM | POA: Insufficient documentation

## 2013-10-26 DIAGNOSIS — S63509A Unspecified sprain of unspecified wrist, initial encounter: Secondary | ICD-10-CM | POA: Insufficient documentation

## 2013-10-26 DIAGNOSIS — X58XXXA Exposure to other specified factors, initial encounter: Secondary | ICD-10-CM | POA: Insufficient documentation

## 2013-10-26 DIAGNOSIS — G8929 Other chronic pain: Secondary | ICD-10-CM

## 2013-10-26 DIAGNOSIS — Y939 Activity, unspecified: Secondary | ICD-10-CM | POA: Insufficient documentation

## 2013-10-26 MED ORDER — DICLOFENAC SODIUM 75 MG PO TBEC: 75.0000 mg | DELAYED_RELEASE_TABLET | Freq: Two times a day (BID) | ORAL | Status: DC

## 2013-10-26 MED ORDER — OXYCODONE-ACETAMINOPHEN 5-325 MG PO TABS: 1.0000 | ORAL_TABLET | ORAL | Status: DC | PRN

## 2013-10-26 NOTE — Discharge Instructions (Signed)
Knee Pain °The knee is the complex joint between your thigh and your lower leg. It is made up of bones, tendons, ligaments, and cartilage. The bones that make up the knee are: °· The femur in the thigh. °· The tibia and fibula in the lower leg. °· The patella or kneecap riding in the groove on the lower femur. °CAUSES  °Knee pain is a common complaint with many causes. A few of these causes are: °· Injury, such as: °¨ A ruptured ligament or tendon injury. °¨ Torn cartilage. °· Medical conditions, such as: °¨ Gout °¨ Arthritis °¨ Infections °· Overuse, over training, or overdoing a physical activity. °Knee pain can be minor or severe. Knee pain can accompany debilitating injury. Minor knee problems often respond well to self-care measures or get well on their own. More serious injuries may need medical intervention or even surgery. °SYMPTOMS °The knee is complex. Symptoms of knee problems can vary widely. Some of the problems are: °· Pain with movement and weight bearing. °· Swelling and tenderness. °· Buckling of the knee. °· Inability to straighten or extend your knee. °· Your knee locks and you cannot straighten it. °· Warmth and redness with pain and fever. °· Deformity or dislocation of the kneecap. °DIAGNOSIS  °Determining what is wrong may be very straight forward such as when there is an injury. It can also be challenging because of the complexity of the knee. Tests to make a diagnosis may include: °· Your caregiver taking a history and doing a physical exam. °· Routine X-rays can be used to rule out other problems. X-rays will not reveal a cartilage tear. Some injuries of the knee can be diagnosed by: °¨ Arthroscopy a surgical technique by which a small video camera is inserted through tiny incisions on the sides of the knee. This procedure is used to examine and repair internal knee joint problems. Tiny instruments can be used during arthroscopy to repair the torn knee cartilage (meniscus). °¨ Arthrography  is a radiology technique. A contrast liquid is directly injected into the knee joint. Internal structures of the knee joint then become visible on X-ray film. °¨ An MRI scan is a non X-ray radiology procedure in which magnetic fields and a computer produce two- or three-dimensional images of the inside of the knee. Cartilage tears are often visible using an MRI scanner. MRI scans have largely replaced arthrography in diagnosing cartilage tears of the knee. °· Blood work. °· Examination of the fluid that helps to lubricate the knee joint (synovial fluid). This is done by taking a sample out using a needle and a syringe. °TREATMENT °The treatment of knee problems depends on the cause. Some of these treatments are: °· Depending on the injury, proper casting, splinting, surgery, or physical therapy care will be needed. °· Give yourself adequate recovery time. Do not overuse your joints. If you begin to get sore during workout routines, back off. Slow down or do fewer repetitions. °· For repetitive activities such as cycling or running, maintain your strength and nutrition. °· Alternate muscle groups. For example, if you are a weight lifter, work the upper body on one day and the lower body the next. °· Either tight or weak muscles do not give the proper support for your knee. Tight or weak muscles do not absorb the stress placed on the knee joint. Keep the muscles surrounding the knee strong. °· Take care of mechanical problems. °¨ If you have flat feet, orthotics or special shoes may help.   See your caregiver if you need help. °¨ Arch supports, sometimes with wedges on the inner or outer aspect of the heel, can help. These can shift pressure away from the side of the knee most bothered by osteoarthritis. °¨ A brace called an "unloader" brace also may be used to help ease the pressure on the most arthritic side of the knee. °· If your caregiver has prescribed crutches, braces, wraps or ice, use as directed. The acronym  for this is PRICE. This means protection, rest, ice, compression, and elevation. °· Nonsteroidal anti-inflammatory drugs (NSAIDs), can help relieve pain. But if taken immediately after an injury, they may actually increase swelling. Take NSAIDs with food in your stomach. Stop them if you develop stomach problems. Do not take these if you have a history of ulcers, stomach pain, or bleeding from the bowel. Do not take without your caregiver's approval if you have problems with fluid retention, heart failure, or kidney problems. °· For ongoing knee problems, physical therapy may be helpful. °· Glucosamine and chondroitin are over-the-counter dietary supplements. Both may help relieve the pain of osteoarthritis in the knee. These medicines are different from the usual anti-inflammatory drugs. Glucosamine may decrease the rate of cartilage destruction. °· Injections of a corticosteroid drug into your knee joint may help reduce the symptoms of an arthritis flare-up. They may provide pain relief that lasts a few months. You may have to wait a few months between injections. The injections do have a small increased risk of infection, water retention, and elevated blood sugar levels. °· Hyaluronic acid injected into damaged joints may ease pain and provide lubrication. These injections may work by reducing inflammation. A series of shots may give relief for as long as 6 months. °· Topical painkillers. Applying certain ointments to your skin may help relieve the pain and stiffness of osteoarthritis. Ask your pharmacist for suggestions. Many over the-counter products are approved for temporary relief of arthritis pain. °· In some countries, doctors often prescribe topical NSAIDs for relief of chronic conditions such as arthritis and tendinitis. A review of treatment with NSAID creams found that they worked as well as oral medications but without the serious side effects. °PREVENTION °· Maintain a healthy weight. Extra pounds  put more strain on your joints. °· Get strong, stay limber. Weak muscles are a common cause of knee injuries. Stretching is important. Include flexibility exercises in your workouts. °· Be smart about exercise. If you have osteoarthritis, chronic knee pain or recurring injuries, you may need to change the way you exercise. This does not mean you have to stop being active. If your knees ache after jogging or playing basketball, consider switching to swimming, water aerobics, or other low-impact activities, at least for a few days a week. Sometimes limiting high-impact activities will provide relief. °· Make sure your shoes fit well. Choose footwear that is right for your sport. °· Protect your knees. Use the proper gear for knee-sensitive activities. Use kneepads when playing volleyball or laying carpet. Buckle your seat belt every time you drive. Most shattered kneecaps occur in car accidents. °· Rest when you are tired. °SEEK MEDICAL CARE IF:  °You have knee pain that is continual and does not seem to be getting better.  °SEEK IMMEDIATE MEDICAL CARE IF:  °Your knee joint feels hot to the touch and you have a high fever. °MAKE SURE YOU:  °· Understand these instructions. °· Will watch your condition. °· Will get help right away if you are not   doing well or get worse. Document Released: 02/13/2007 Document Revised: 07/11/2011 Document Reviewed: 02/13/2007 [facility] Patient Information 2015 [place], [place]. This information is not intended to replace advice given to you by your health care provider. Make sure you discuss any questions you have with your health care provider.  Wrist Pain A wrist sprain happens when the bands of tissue that hold the wrist joints together (ligament) stretch too much or tear. A wrist strain happens when muscles or bands of tissue that connect muscles to bones (tendons) are stretched or pulled. HOME CARE  Put ice on the injured area.  Put ice in a plastic bag.  Place a towel  between your skin and the bag.  Leave the ice on for 15-20 minutes, 03-04 times a day, for the first 2 days.  Raise (elevate) the injured wrist to lessen puffiness (swelling).  Rest the injured wrist for at least 48 hours or as told by your doctor.  Wear a splint, cast, or an elastic wrap as told by your doctor.  Only take medicine as told by your doctor.  Follow up with your doctor as told. This is important. GET HELP RIGHT AWAY IF:   The fingers are puffy, very red, white, or cold and blue.  The fingers lose feeling (numb) or tingle.  The pain gets worse.  It is hard to move the fingers. MAKE SURE YOU:   Understand these instructions.  Will watch your condition.  Will get help right away if you are not doing well or get worse. Document Released: 10/05/2007 Document Revised: 07/11/2011 Document Reviewed: 06/09/2010 [facility] Patient Information 2015 [place], [place]. This information is not intended to replace advice given to you by your health care provider. Make sure you discuss any questions you have with your health care provider.

## 2013-10-26 NOTE — ED Notes (Signed)
Awaiting discharge paperwork at present.

## 2013-10-26 NOTE — ED Notes (Signed)
Patient with no complaints at this time. Respirations even and unlabored. Skin warm/dry. Discharge instructions reviewed with patient at this time. Patient given opportunity to voice concerns/ask questions. Patient discharged at this time and left Emergency Department with steady gait.   

## 2013-10-26 NOTE — ED Notes (Signed)
Patient c/o right knee pain. Per patient started originally hurting in April, seen doctor for it and given antiinflammatory which helped. Patient reports pain starting again on Thursday. Patient also c/o left wrist pain with no known injury. Patient reports being diagnosed with gout in past.

## 2013-10-26 NOTE — ED Provider Notes (Signed)
CSN: [id number]     Arrival date & time 10/26/13  0744 History   First MD Initiated Contact with Patient 10/26/13 0755     Chief Complaint  Patient presents with  . Wrist Pain  . Knee Pain     (Consider location/radiation/quality/duration/timing/severity/associated sxs/prior Treatment) Patient is a 38 y.o. male presenting with wrist pain and knee pain. The history is provided by the patient.  Wrist Pain This is a new problem. The current episode started yesterday. The problem occurs constantly. The problem has been unchanged. Associated symptoms include arthralgias. Pertinent negatives include no chills, fever, joint swelling, neck pain, numbness, rash, sore throat, vomiting or weakness. The symptoms are aggravated by bending. He has tried nothing for the symptoms. The treatment provided no relief.  Knee Pain Location:  Knee Time since incident:  2 days Injury: no   Knee location:  R knee Pain details:    Quality:  Burning Chronicity:  Recurrent Dislocation: no   Foreign body present:  No foreign bodies Prior injury to area:  Yes Relieved by:  Nothing Worsened by:  Activity, bearing weight and flexion Ineffective treatments:  Acetaminophen Associated symptoms: no back pain, no decreased ROM, no fever, no neck pain, no numbness, no stiffness, no swelling and no tingling    Patient reports right knee pain for 2 days.  Has hx of previous knee pain 2 months ago and was seen by his PMD who gave him an anti-inflammatory which helps alleviate his pain.  He denies known injury to his knee, but does state that he has been going up and down steps a lot recently.  States the pain feels similar to previous.  He also c/o left wrist pain after a direct blow to the wrist yesterday when a piece of shelving struck it.  He denies numbness or weakness of the joint, no elbow tenderness.    Past Medical History  Diagnosis Date  . Chronic back pain    Past Surgical History  Procedure Laterality Date    . Tonsillectomy    . Shoulder surgery    . Knee surgery    . Vasectomy     History reviewed. No pertinent family history. History  Substance Use Topics  . Smoking status: Never Smoker   . Smokeless tobacco: Never Used  . Alcohol Use: Yes     Comment: occ    Review of Systems  Constitutional: Negative for fever and chills.  HENT: Negative for sore throat.   Gastrointestinal: Negative for vomiting.  Genitourinary: Negative for dysuria and difficulty urinating.  Musculoskeletal: Positive for arthralgias. Negative for back pain, gait problem, joint swelling, neck pain and stiffness.       Right knee and left wrist pain  Skin: Negative for color change, rash and wound.  Neurological: Negative for weakness and numbness.  All other systems reviewed and are negative.     Allergies  Review of patient's allergies indicates no known allergies.  Home Medications   Prior to Admission medications   Medication Sig Start Date End Date Taking? Authorizing Leevon Upperman  acetaminophen (TYLENOL) 500 MG tablet Take 1,000 mg by mouth every 6 (six) hours as needed for pain.    Historical Arvell Pulsifer, MD  cyclobenzaprine (FLEXERIL) 10 MG tablet Take 1 tablet (10 mg total) by mouth 3 (three) times daily as needed. 12/16/12   Tammy L. Triplett, PA-C  diclofenac (VOLTAREN) 75 MG EC tablet Take 1 tablet (75 mg total) by mouth 2 (two) times daily. Take with food 12/16/12  Tammy L. Triplett, PA-C  ibuprofen (ADVIL,MOTRIN) 200 MG tablet Take 400-800 mg by mouth every 6 (six) hours as needed for pain.    Historical Dorianne Perret, MD  oxyCODONE-acetaminophen (PERCOCET/ROXICET) 5-325 MG per tablet Take 1 tablet by mouth every 4 (four) hours as needed for pain. 12/16/12   Tammy L. Triplett, PA-C   BP 126/76  Pulse 60  Temp(Src) 97.9 F (36.6 C) (Oral)  Resp 16  Ht 5\' 9"  (1.753 m)  Wt 290 lb (131.543 kg)  BMI 42.81 kg/m2 Physical Exam  Nursing note and vitals reviewed. Constitutional: He is oriented to person,  place, and time. He appears well-developed and well-nourished. No distress.  Neck: Normal range of motion. Neck supple.  Cardiovascular: Normal rate, regular rhythm, normal heart sounds and intact distal pulses.   No murmur heard. Pulmonary/Chest: Effort normal and breath sounds normal. No respiratory distress.  Musculoskeletal: Normal range of motion. He exhibits tenderness. He exhibits no edema.       Left wrist: He exhibits tenderness. He exhibits normal range of motion, no bony tenderness, no swelling, no effusion, no crepitus, no deformity and no laceration.       Arms:      Legs: ttp of the lateral right knee.  Moderate patellar crepitus.  No erythema, effusion, or step-off deformity.  DP pulse brisk, distal sensation intact. Calf is soft and NT.  Slight ttp of the dorsal left wrist.  No STS or erythema, pt has full ROM of the wrist.  No snuffbox tenderness.  NV intact  Neurological: He is alert and oriented to person, place, and time. He exhibits normal muscle tone. Coordination normal.  Skin: Skin is warm and dry. No erythema.    ED Course  Procedures (including critical care time) Labs Review Labs Reviewed - No data to display  Imaging Review Dg Knee Complete 4 Views Right  10/26/2013   CLINICAL DATA:  WRIST PAIN KNEE PAIN  EXAM: RIGHT KNEE - COMPLETE 4+ VIEW  COMPARISON:  None.  FINDINGS: Tiny marginal spurs from the lateral femoral condyle, patella, and medial tibial plateau. Old healed proximal fibular shaft fracture. No acute fracture or dislocation. No effusion. Normal mineralization and alignment.  IMPRESSION: 1. Negative for fracture or other acute abnormality. 2. Early tricompartmental spurring.   Electronically Signed   By: Oley Balmaniel  Hassell M.D.   On: 10/26/2013 09:00    EKG Interpretation None      MDM   Final diagnoses:  Sprain of left wrist, initial encounter  Knee pain, chronic, right    XR results discussed with pt.  No concerning sx's for septic joint.  Wrist  pain likely related to sprain and imaging not indicated at this time.  Pt agrees to symptomatic treatment with knee immobilizer for comfort and wrist splint.  Given referral info to orthopedics, #10 percocet and diclofenac.  Pt is stable for d/c and agrees to plan.      Tammy L. Trisha Mangleriplett, PA-C 10/27/13 1445

## 2013-10-29 NOTE — ED Provider Notes (Signed)
Medical screening examination/treatment/procedure(s) were performed by non-physician practitioner and as supervising physician I was immediately available for consultation/collaboration.   EKG Interpretation None        Scott Zackowski, MD 10/29/13 0821 

## 2014-02-25 ENCOUNTER — Emergency Department (HOSPITAL_COMMUNITY)
Admission: EM | Admit: 2014-02-25 | Discharge: 2014-02-25 | Disposition: A | Discharge status: Home / Self Care | Payer: BC Managed Care – PPO | Attending: Emergency Medicine | Admitting: Emergency Medicine

## 2014-02-25 ENCOUNTER — Encounter (HOSPITAL_COMMUNITY): Payer: Self-pay | Admitting: Emergency Medicine

## 2014-02-25 ENCOUNTER — Emergency Department (HOSPITAL_COMMUNITY): Payer: BC Managed Care – PPO

## 2014-02-25 DIAGNOSIS — R630 Anorexia: Secondary | ICD-10-CM | POA: Diagnosis not present

## 2014-02-25 DIAGNOSIS — J069 Acute upper respiratory infection, unspecified: Secondary | ICD-10-CM | POA: Diagnosis not present

## 2014-02-25 DIAGNOSIS — G8929 Other chronic pain: Secondary | ICD-10-CM | POA: Insufficient documentation

## 2014-02-25 DIAGNOSIS — J329 Chronic sinusitis, unspecified: Secondary | ICD-10-CM | POA: Insufficient documentation

## 2014-02-25 DIAGNOSIS — R05 Cough: Secondary | ICD-10-CM

## 2014-02-25 DIAGNOSIS — R51 Headache: Secondary | ICD-10-CM | POA: Insufficient documentation

## 2014-02-25 DIAGNOSIS — R059 Cough, unspecified: Secondary | ICD-10-CM

## 2014-02-25 DIAGNOSIS — R0981 Nasal congestion: Secondary | ICD-10-CM | POA: Diagnosis present

## 2014-02-25 MED ORDER — IBUPROFEN 800 MG PO TABS: 800.0000 mg | ORAL_TABLET | Freq: Three times a day (TID) | ORAL | Status: DC

## 2014-02-25 MED ORDER — PREDNISONE 50 MG PO TABS
60.0000 mg | ORAL_TABLET | Freq: Once | ORAL | Status: AC
  Administered 2014-02-25: 60 mg via ORAL
  Filled 2014-02-25 (×2): qty 1

## 2014-02-25 MED ORDER — PSEUDOEPHEDRINE HCL 60 MG PO TABS
60.0000 mg | ORAL_TABLET | Freq: Once | ORAL | Status: AC
  Administered 2014-02-25: 60 mg via ORAL
  Filled 2014-02-25: qty 1

## 2014-02-25 MED ORDER — IBUPROFEN 800 MG PO TABS
800.0000 mg | ORAL_TABLET | Freq: Once | ORAL | Status: AC
  Administered 2014-02-25: 800 mg via ORAL
  Filled 2014-02-25: qty 1

## 2014-02-25 MED ORDER — PHENYLEPH-PROMETHAZINE-COD 5-6.25-10 MG/5ML PO SYRP: ORAL_SOLUTION | ORAL | Status: DC

## 2014-02-25 MED ORDER — DEXAMETHASONE 4 MG PO TABS: 4.0000 mg | ORAL_TABLET | Freq: Two times a day (BID) | ORAL | Status: DC

## 2014-02-25 NOTE — ED Provider Notes (Signed)
CSN: [id number]     Arrival date & time 02/25/14  1404 History   First MD Initiated Contact with Patient 02/25/14 1534     Chief Complaint  Patient presents with  . Cough  . Nasal Congestion     (Consider location/radiation/quality/duration/timing/severity/associated sxs/prior Treatment) Patient is a 38 y.o. male presenting with URI. The history is provided by the patient.  URI Presenting symptoms: congestion, cough and fatigue   Presenting symptoms: no fever   Presenting symptoms comment:  Headache Severity:  Moderate Onset quality:  Gradual Duration:  3 days Timing:  Intermittent Progression:  Worsening Chronicity:  New Relieved by:  Nothing Associated symptoms: headaches and myalgias   Associated symptoms: no arthralgias, no neck pain and no wheezing   Risk factors: no diabetes mellitus, no immunosuppression and no recent travel     Past Medical History  Diagnosis Date  . Chronic back pain    Past Surgical History  Procedure Laterality Date  . Tonsillectomy    . Shoulder surgery    . Knee surgery    . Vasectomy     History reviewed. No pertinent family history. History  Substance Use Topics  . Smoking status: Never Smoker   . Smokeless tobacco: Never Used  . Alcohol Use: Yes     Comment: occ    Review of Systems  Constitutional: Positive for appetite change and fatigue. Negative for fever and activity change.       All ROS Neg except as noted in HPI  HENT: Positive for congestion, postnasal drip and sinus pressure.   Eyes: Negative for photophobia and discharge.  Respiratory: Positive for cough. Negative for shortness of breath and wheezing.   Cardiovascular: Negative for chest pain and palpitations.  Gastrointestinal: Negative for abdominal pain and blood in stool.  Genitourinary: Negative for dysuria, frequency and hematuria.  Musculoskeletal: Positive for myalgias. Negative for arthralgias, back pain and neck pain.  Skin: Negative.   Neurological:  Positive for headaches. Negative for dizziness, seizures and speech difficulty.  Psychiatric/Behavioral: Negative for hallucinations and confusion.      Allergies  Review of patient's allergies indicates no known allergies.  Home Medications   Prior to Admission medications   Medication Sig Start Date End Date Taking? Authorizing Provider  DM-Doxylamine-Acetaminophen (NYQUIL COLD & FLU PO) Take 2 capsules by mouth daily as needed (for cold symptoms).   Yes Historical Provider, MD  ibuprofen (ADVIL,MOTRIN) 200 MG tablet Take 400-800 mg by mouth every 6 (six) hours as needed for pain.   Yes Historical Provider, MD  Pseudoephedrine-Acetaminophen (SINUS PO) Take 2 tablets by mouth every 4 (four) hours as needed (for congestion symptoms).   Yes Historical Provider, MD   BP 126/70  Pulse 67  Temp(Src) 98.5 F (36.9 C) (Oral)  Resp 16  Ht 5\' 9"  (1.753 m)  Wt 290 lb (131.543 kg)  BMI 42.81 kg/m2  SpO2 100% Physical Exam  Nursing note and vitals reviewed. Constitutional: He is oriented to person, place, and time. He appears well-developed and well-nourished.  Non-toxic appearance.  HENT:  Head: Normocephalic.  Right Ear: Tympanic membrane and external ear normal.  Left Ear: Tympanic membrane and external ear normal.  Nasal congestion present.  Eyes: EOM and lids are normal. Pupils are equal, round, and reactive to light.  Neck: Normal range of motion. Neck supple. Carotid bruit is not present.  Cardiovascular: Normal rate, regular rhythm, normal heart sounds, intact distal pulses and normal pulses.   Pulmonary/Chest: Breath sounds normal. No respiratory distress.  Abdominal: Soft. Bowel sounds are normal. There is no tenderness. There is no guarding.  Musculoskeletal: Normal range of motion.  Lymphadenopathy:       Head (right side): No submandibular adenopathy present.       Head (left side): No submandibular adenopathy present.    He has no cervical adenopathy.  Neurological: He  is alert and oriented to person, place, and time. He has normal strength. No cranial nerve deficit or sensory deficit.  Skin: Skin is warm and dry.  Psychiatric: He has a normal mood and affect. His speech is normal.    ED Course  Procedures (including critical care time) Labs Review Labs Reviewed - No data to display  Imaging Review Dg Chest 2 View  02/25/2014   CLINICAL DATA:  Four day history of cough  EXAM: CHEST  2 VIEW  COMPARISON:  July 09, 2011  FINDINGS: The lungs are clear. The heart size and pulmonary vascularity are normal. No adenopathy. There is slight upper thoracic levoscoliosis.  IMPRESSION: No edema or consolidation.   Electronically Signed   By: Bretta BangWilliam  Woodruff M.D.   On: 02/25/2014 15:11     EKG Interpretation None      MDM  Chest x-ray is negative for pneumonia or acute cardiorespiratory problems. Vital signs are within normal limits. Pulse oximetry 100% on room air. Within normal limits by my interpretation. The examination is consistent with an upper respiratory infection and sinusitis. Prescription for Decadron, ibuprofen, and promethazine codeine VC cough medication given to the patient. Patient advised to increase fluids and to wash hands frequently. Patient is provided with mask to use until symptoms have resolved.    Final diagnoses:  Sinusitis, unspecified chronicity, unspecified location  URI (upper respiratory infection)    **I have reviewed nursing notes, vital signs, and all appropriate lab and imaging results for this patient.Kathie Dike*    Astra Gregg M Berlyn Malina, PA-C 02/27/14 1153  Raeford RazorStephen Kohut, MD 03/06/14 1031

## 2014-02-25 NOTE — ED Notes (Signed)
Patient c/o coughing with nasal congestion and headache since Saturday. Per patient taken over counter medication-Nightquil and sinus medication with no relief. Denies any fevers. Per patient had nausea and vomiting Saturday and Sunday but denies any yesterday or today.

## 2014-02-25 NOTE — Discharge Instructions (Signed)
Sinusitis PLEASE INCREASE FLUIDS. PLEASE WASH HANDS FREQUENTLY. USE ALLEGRA D IF NOT USING THE PROMETHAZINE VC COUGH MEDICATION. TAKE DECADRON AND IBUPROFEN WITH FOOD. USE MASK UNTIL SYMPTOMS RESOLVE.                                                                     Sinusitis is redness, soreness, and inflammation of the paranasal sinuses. Paranasal sinuses are air pockets within the bones of your face (beneath the eyes, the middle of the forehead, or above the eyes). In healthy paranasal sinuses, mucus is able to drain out, and air is able to circulate through them by way of your nose. However, when your paranasal sinuses are inflamed, mucus and air can become trapped. This can allow bacteria and other germs to grow and cause infection. Sinusitis can develop quickly and last only a short time (acute) or continue over a long period (chronic). Sinusitis that lasts for more than 12 weeks is considered chronic.  CAUSES  Causes of sinusitis include:  Allergies.  Structural abnormalities, such as displacement of the cartilage that separates your nostrils (deviated septum), which can decrease the air flow through your nose and sinuses and affect sinus drainage.  Functional abnormalities, such as when the small hairs (cilia) that line your sinuses and help remove mucus do not work properly or are not present. SIGNS AND SYMPTOMS  Symptoms of acute and chronic sinusitis are the same. The primary symptoms are pain and pressure around the affected sinuses. Other symptoms include:  Upper toothache.  Earache.  Headache.  Bad breath.  Decreased sense of smell and taste.  A cough, which worsens when you are lying flat.  Fatigue.  Fever.  Thick drainage from your nose, which often is green and may contain pus (purulent).  Swelling and warmth over the affected sinuses. DIAGNOSIS  Your health care provider will perform a physical exam. During the exam, your health care provider may:  Look in your  nose for signs of abnormal growths in your nostrils (nasal polyps).  Tap over the affected sinus to check for signs of infection.  View the inside of your sinuses (endoscopy) using an imaging device that has a light attached (endoscope). If your health care provider suspects that you have chronic sinusitis, one or more of the following tests may be recommended:  Allergy tests.  Nasal culture. A sample of mucus is taken from your nose, sent to a lab, and screened for bacteria.  Nasal cytology. A sample of mucus is taken from your nose and examined by your health care provider to determine if your sinusitis is related to an allergy. TREATMENT  Most cases of acute sinusitis are related to a viral infection and will resolve on their own within 10 days. Sometimes medicines are prescribed to help relieve symptoms (pain medicine, decongestants, nasal steroid sprays, or saline sprays).  However, for sinusitis related to a bacterial infection, your health care provider will prescribe antibiotic medicines. These are medicines that will help kill the bacteria causing the infection.  Rarely, sinusitis is caused by a fungal infection. In theses cases, your health care provider will prescribe antifungal medicine. For some cases of chronic sinusitis, surgery is needed. Generally, these are cases in which sinusitis recurs more than 3  times per year, despite other treatments. HOME CARE INSTRUCTIONS   Drink plenty of water. Water helps thin the mucus so your sinuses can drain more easily.  Use a humidifier.  Inhale steam 3 to 4 times a day (for example, sit in the bathroom with the shower running).  Apply a warm, moist washcloth to your face 3 to 4 times a day, or as directed by your health care provider.  Use saline nasal sprays to help moisten and clean your sinuses.  Take medicines only as directed by your health care provider.  If you were prescribed either an antibiotic or antifungal medicine,  finish it all even if you start to feel better. SEEK IMMEDIATE MEDICAL CARE IF:  You have increasing pain or severe headaches.  You have nausea, vomiting, or drowsiness.  You have swelling around your face.  You have vision problems.  You have a stiff neck.  You have difficulty breathing. MAKE SURE YOU:   Understand these instructions.  Will watch your condition.  Will get help right away if you are not doing well or get worse. Document Released: 04/18/2005 Document Revised: 09/02/2013 Document Reviewed: 05/03/2011 [facility] Patient Information 2015 [employer] [place]. This information is not intended to replace advice given to you by your health care provider. Make sure you discuss any questions you have with your health care provider.

## 2016-11-19 ENCOUNTER — Emergency Department (HOSPITAL_COMMUNITY): Payer: Self-pay

## 2016-11-19 ENCOUNTER — Emergency Department (HOSPITAL_COMMUNITY)
Admission: EM | Admit: 2016-11-19 | Discharge: 2016-11-19 | Disposition: A | Discharge status: Home / Self Care | Payer: Self-pay | Attending: Emergency Medicine | Admitting: Emergency Medicine

## 2016-11-19 ENCOUNTER — Encounter (HOSPITAL_COMMUNITY): Payer: Self-pay | Admitting: Emergency Medicine

## 2016-11-19 DIAGNOSIS — M25561 Pain in right knee: Secondary | ICD-10-CM | POA: Insufficient documentation

## 2016-11-19 DIAGNOSIS — F17200 Nicotine dependence, unspecified, uncomplicated: Secondary | ICD-10-CM | POA: Insufficient documentation

## 2016-11-19 DIAGNOSIS — M25461 Effusion, right knee: Secondary | ICD-10-CM | POA: Insufficient documentation

## 2016-11-19 MED ORDER — IBUPROFEN 600 MG PO TABS: 600.0000 mg | ORAL_TABLET | Freq: Four times a day (QID) | ORAL | 0 refills | Status: DC | PRN

## 2016-11-19 NOTE — ED Triage Notes (Signed)
Pt c/o right knee pain since Thursday.

## 2016-11-19 NOTE — Discharge Instructions (Signed)
Schedule to see the [facility] for evaluation in 1 week if pain persist

## 2016-11-19 NOTE — ED Provider Notes (Signed)
[facility]-EMER[facility]ENCY DEPT Provider Note   CSN: [id number] Arrival date & time: 11/19/16  1851     History   Chief Complaint Chief Complaint  Patient presents with  . Knee Pain    HPI George Gallagher is a 41 y.o. male.  The history is provided by the patient. No language interpreter was used.  Knee Pain   This is a new problem. The current episode started more than 2 days ago. The problem occurs constantly. The problem has been gradually worsening. The pain is present in the right knee. The pain is moderate. Pertinent negatives include no numbness. He has tried nothing for the symptoms. The treatment provided no relief. There has been a history of trauma.  Pt reports he has pain and swelling in his knee. Pt reports pain with walking  Past Medical History:  Diagnosis Date  . Chronic back pain     Patient Active Problem List   Diagnosis Date Noted  . 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 RUPTURE ROTATOR CUFF 01/30/2007    Past Surgical History:  Procedure Laterality Date  . KNEE SURGERY    . SHOULDER SURGERY    . TONSILLECTOMY    . VASECTOMY         Home Medications    Prior to Admission medications   Medication Sig Start Date End Date Taking? Authorizing Provider  dexamethasone (DECADRON) 4 MG tablet Take 1 tablet (4 mg total) by mouth 2 (two) times daily with a meal. 02/25/14   Ivery QualeBryant, Hobson, PA-C  DM-Doxylamine-Acetaminophen (NYQUIL COLD & FLU PO) Take 2 capsules by mouth daily as needed (for cold symptoms).    [provider]  ibuprofen (ADVIL,MOTRIN) 200 MG tablet Take 400-800 mg by mouth every 6 (six) hours as needed for pain.    [provider]  ibuprofen (ADVIL,MOTRIN) 800 MG tablet Take 1 tablet (800 mg total) by mouth 3 (three) times daily. 02/25/14   Ivery QualeBryant, Hobson, PA-C  Phenyleph-Promethazine-Cod 5-6.25-10 MG/5ML SYRP 5ml po q6h prn cough/congestion. 02/25/14   Ivery QualeBryant, Hobson, PA-C  Pseudoephedrine-Acetaminophen (SINUS PO) Take 2  tablets by mouth every 4 (four) hours as needed (for congestion symptoms).    [provider]    Family History No family history on file.  Social History Social History  Substance Use Topics  . Smoking status: Current Some Day Smoker  . Smokeless tobacco: Never Used  . Alcohol use Yes     Comment: occ     Allergies   Patient has no known allergies.   Review of Systems Review of Systems  Neurological: Negative for numbness.  All other systems reviewed and are negative.    Physical Exam Updated Vital Signs BP 131/84   Pulse 68   Temp 97.8 F (36.6 C)   Resp 20   Ht 5\' 9"  (1.753 m)   Wt 132.5 kg (292 lb)   SpO2 98%   BMI 43.12 kg/m   Physical Exam  Constitutional: He is oriented to person, place, and time. He appears well-developed and well-nourished.  HENT:  Head: Normocephalic.  Eyes: Pupils are equal, round, and reactive to light. EOM are normal.  Neck: Normal range of motion.  Cardiovascular: Normal rate.   Musculoskeletal: He exhibits tenderness.  Swollen right knee,  Small effusion,  Pain with movement, no instability  Neurological: He is alert and oriented to person, place, and time.  Skin: Skin is warm.  Psychiatric: He has a normal mood and affect.  Nursing note and vitals reviewed.  ED Treatments / Results  Labs (all labs ordered are listed, but only abnormal results are displayed) Labs Reviewed - No data to display  EKG  EKG Interpretation None       Radiology Dg Knee Complete 4 Views Right  Result Date: 11/19/2016 CLINICAL DATA:  Right knee pain after lifting items on Thursday and heard a pop. Pain since then. EXAM: RIGHT KNEE - COMPLETE 4+ VIEW COMPARISON:  10/26/2013 FINDINGS: Tricompartmental osteoarthritic joint space narrowing and spurring is noted. Minimal spurring of the tibial eminence bilaterally. Trace joint effusion. No acute appearing fracture or dislocations. Soft tissue, meniscal and ligamentous injuries would  be better assessed with nonemergent MRI if clinically necessary. IMPRESSION: Mild progression of tricompartmental osteoarthritis with trace joint effusion. No acute fracture nor dislocations noted. Electronically Signed   By: Tollie Eth M.D.   On: 11/19/2016 19:51    Procedures Procedures (including critical care time)  Medications Ordered in ED Medications - No data to display   Initial Impression / Assessment and Plan / ED Course  I have reviewed the triage vital signs and the nursing notes.  Pertinent labs & imaging results that were available during my care of the patient were reviewed by me and considered in my medical decision making (see chart for details).       Final Clinical Impressions(s) / ED Diagnoses   Final diagnoses:  Acute pain of right knee  Effusion of right knee    New Prescriptions New Prescriptions   IBUPROFEN (ADVIL,MOTRIN) 600 MG TABLET    Take 1 tablet (600 mg total) by mouth every 6 (six) hours as needed.  knee immbolizer An After Visit Summary was printed and given to the patient.    [name] 11/19/16 2009    Benjiman Core, MD 11/20/16 [phone]

## 2016-11-19 NOTE — ED Notes (Addendum)
Pt reports that on Thursday, he was lifting stuff and his knee popped-   He has iced, ibuprofen, tylenol and his knee, while better, continues to be painful  He has seen Dr Hilda LiasKeeling in the past

## 2016-11-23 ENCOUNTER — Encounter: Payer: Self-pay | Admitting: Orthopaedic Surgery

## 2016-11-23 ENCOUNTER — Ambulatory Visit (INDEPENDENT_AMBULATORY_CARE_PROVIDER_SITE_OTHER): Payer: Worker's Compensation | Admitting: Orthopaedic Surgery

## 2016-11-23 VITALS — BP 113/81 | HR 72 | Temp 97.4°F | Ht 68.5 in | Wt 299.0 lb

## 2016-11-23 DIAGNOSIS — M25561 Pain in right knee: Secondary | ICD-10-CM | POA: Diagnosis not present

## 2016-11-23 NOTE — Progress Notes (Signed)
Subjective:    Patient ID: George Gallagher, male    DOB: [birth year], 41 y.o.   MRN: [id number]  HPI He had an on the job injury November 17, 2016 while working at [employer] [place] 4:00 PM.  He reported the injury to Clovis CaoMike Smith, his supervisor. He was pulling a pallet, it stopped which interfered with his momentum and he stopped suddenly and felt a pop in his right knee. He almost fell but caught himself.  He had medial pain.  He started having swelling.  He had pain putting weight on it.  He finished his shift.  He went back to work the next day and was limping.  He had more swelling and more pain.  He was able to barely finish his shift.  He went to the ER the next day because of the pain and swelling.    The ER evaluated him on July 21st. X-rays were done.  No acute injury other than effusion was noted.  He was given ibuprofen.  He has continued swelling and pain.  He has pain with weight bearing and full extension.  He has no giving way.   He has been out of work since the 21st.   Review of Systems  HENT: Negative for congestion.   Respiratory: Negative for cough and shortness of breath.   Cardiovascular: Negative for chest pain and leg swelling.  Endocrine: Negative for cold intolerance.  Musculoskeletal: Positive for arthralgias, gait problem and joint swelling.  Allergic/Immunologic: Negative for environmental allergies.   Past Medical History:  Diagnosis Date  . Chronic back pain     Past Surgical History:  Procedure Laterality Date  . KNEE SURGERY    . SHOULDER SURGERY    . TONSILLECTOMY    . VASECTOMY      Current Outpatient Prescriptions on File Prior to Visit  Medication Sig Dispense Refill  . ibuprofen (ADVIL,MOTRIN) 600 MG tablet Take 1 tablet (600 mg total) by mouth every 6 (six) hours as needed. 30 tablet 0  . tamsulosin (FLOMAX) 0.4 MG CAPS capsule Take 0.4 mg by mouth daily.  5   No current facility-administered medications on file prior to visit.       Social History   Social History  . Marital status: Married    Spouse name: N/A  . Number of children: N/A  . Years of education: N/A   Occupational History  . Not on file.   Social History Main Topics  . Smoking status: Current Some Day Smoker  . Smokeless tobacco: Never Used  . Alcohol use Yes     Comment: occ  . Drug use: No  . Sexual activity: Not on file   Other Topics Concern  . Not on file   Social History Narrative  . No narrative on file    Family History  Problem Relation Age of Onset  . Diabetes Maternal Grandfather     BP 113/81   Pulse 72   Temp (!) 97.4 F (36.3 C)   Ht 5' 8.5" (1.74 m)   Wt 299 lb (135.6 kg)   BMI 44.80 kg/m      Objective:   Physical Exam  Constitutional: He is oriented to person, place, and time. He appears well-developed and well-nourished.  HENT:  Head: Normocephalic and atraumatic.  Eyes: Pupils are equal, round, and reactive to light. Conjunctivae and EOM are normal.  Neck: Normal range of motion. Neck supple.  Cardiovascular: Normal rate, regular rhythm and  intact distal pulses.   Pulmonary/Chest: Effort normal.  Abdominal: Soft.  Musculoskeletal: He exhibits tenderness (Pain right knee with 2+ effusion, ROM 0 to 90 with pain, medial laxity 1+, negative drawer, weakly positive medial [place], [place] intact, limp right, left knee negative.).  Neurological: He is alert and oriented to person, place, and time. He has normal reflexes. He displays normal reflexes. No cranial nerve deficit. He exhibits normal muscle tone. Coordination normal.  Skin: Skin is warm and dry.  Psychiatric: He has a normal mood and affect. His behavior is normal. Judgment and thought content normal.  Vitals reviewed.    X-rays were reviewed.     Assessment & Plan:   Encounter Diagnosis  Name Primary?  . Acute pain of right knee Yes   PROCEDURE NOTE:  The patient request injection, verbal consent was obtained.  The right knee was  prepped appropriately after time out was performed.   Sterile technique was observed and anesthesia was provided by ethyl chloride and a 20-gauge needle was used to inject the knee area.  A 16-gauge needle was then used to aspirate the knee.  Color of fluid aspirated was yellowish  Total cc's aspirated was 30.    Injection of 1 cc of Depo-Medrol 40 mg with several cc's of plain xylocaine was then performed.  A band aid dressing was applied.  The patient was advised to apply ice later today and tomorrow to the injection sight as needed.  I have given knee brace.  He is to stay out of work.  He is to continue his ibuprofen.  He may need MRI.  He is to avoid any squatting.  Return in one week.  Call if any problem.  Precautions discussed.   Electronically Signed Darreld McleanWayne Iyahna Obriant, MD 7/25/201810:50 AM

## 2016-11-23 NOTE — Patient Instructions (Signed)
Out of work 

## 2016-11-30 ENCOUNTER — Ambulatory Visit (INDEPENDENT_AMBULATORY_CARE_PROVIDER_SITE_OTHER): Payer: Worker's Compensation | Admitting: Orthopaedic Surgery

## 2016-11-30 ENCOUNTER — Encounter: Payer: Self-pay | Admitting: Orthopaedic Surgery

## 2016-11-30 VITALS — BP 145/98 | HR 65 | Temp 97.7°F | Ht 68.5 in | Wt 300.0 lb

## 2016-11-30 DIAGNOSIS — M109 Gout, unspecified: Secondary | ICD-10-CM | POA: Diagnosis not present

## 2016-11-30 DIAGNOSIS — M25561 Pain in right knee: Secondary | ICD-10-CM | POA: Diagnosis not present

## 2016-11-30 MED ORDER — ALLOPURINOL 300 MG PO TABS: 300.0000 mg | ORAL_TABLET | Freq: Every day | ORAL | 5 refills | Status: DC

## 2016-11-30 NOTE — Addendum Note (Signed)
Addended by: Earnstine RegalKEELING, JOHN W on: 11/30/2016 10:28 AM   Modules accepted: Orders

## 2016-11-30 NOTE — Patient Instructions (Signed)
Return to work full duty 12-05-16

## 2016-11-30 NOTE — Progress Notes (Signed)
Patient ZO:XWRUEAV:George Gallagher, male DOB:24-Mar-1976, 41 y.o. WUJ:[id number]  Chief Complaint  Patient presents with  . Knee Pain    right  MRI results    HPI  George Gallagher is a 41 y.o. male who has had problems with the right knee.  He has a Worker's Comp injury.  He had a MRI of the right knee which showed no meniscus or ligament tear.  He has some patellofemoral osteoarthritis.  I have explained the findings to him.  He does not need surgery.    He may return to full duty work Monday.  He is to wear his knee brace as needed.  I will see him back in two weeks.  He is to call if any problem.  He also related he has history of gout.  He did not tell me this before. He is not taking any medicine.  I will get serum uric acid done.  I will begin allopurinol.  I have told him to take the allopurinol daily from now on.  HPI  Body mass index is 44.95 kg/m.  ROS  Review of Systems  HENT: Negative for congestion.   Respiratory: Negative for cough and shortness of breath.   Cardiovascular: Negative for chest pain and leg swelling.  Endocrine: Negative for cold intolerance.  Musculoskeletal: Positive for arthralgias, gait problem and joint swelling.  Allergic/Immunologic: Negative for environmental allergies.    Past Medical History:  Diagnosis Date  . Chronic back pain     Past Surgical History:  Procedure Laterality Date  . KNEE SURGERY    . SHOULDER SURGERY    . TONSILLECTOMY    . VASECTOMY      Family History  Problem Relation Age of Onset  . Diabetes Maternal Grandfather     Social History Social History  Substance Use Topics  . Smoking status: Current Some Day Smoker  . Smokeless tobacco: Never Used  . Alcohol use Yes     Comment: occ    No Known Allergies  Current Outpatient Prescriptions  Medication Sig Dispense Refill  . ibuprofen (ADVIL,MOTRIN) 600 MG tablet Take 1 tablet (600 mg total) by mouth every 6 (six) hours as needed. 30 tablet 0  . tamsulosin  (FLOMAX) 0.4 MG CAPS capsule Take 0.4 mg by mouth daily.  5   No current facility-administered medications for this visit.      Physical Exam  Blood pressure (!) 145/98, pulse 65, temperature 97.7 F (36.5 C), height 5' 8.5" (1.74 m), weight 300 lb (136.1 kg).  Constitutional: overall normal hygiene, normal nutrition, well developed, normal grooming, normal body habitus. Assistive device:patella tracker brace  Musculoskeletal: gait and station Limp slight to right, muscle tone and strength are normal, no tremors or atrophy is present.  .  Neurological: coordination overall normal.  Deep tendon reflex/nerve stretch intact.  Sensation normal.  Cranial nerves II-XII intact.   Skin:   Normal overall no scars, lesions, ulcers or rashes. No psoriasis.  Psychiatric: Alert and oriented x 3.  Recent memory intact, remote memory unclear.  Normal mood and affect. Well groomed.  Good eye contact.  Cardiovascular: overall no swelling, no varicosities, no edema bilaterally, normal temperatures of the legs and arms, no clubbing, cyanosis and good capillary refill.  Lymphatic: palpation is normal.  His right knee has no effusion today. It is tender more medially.  He has near full ROM today.  NV intact.  It is stable.  The patient has been educated about the nature of  the problem(s) and counseled on treatment options.  The patient appeared to understand what I have discussed and is in agreement with it.  Encounter Diagnoses  Name Primary?  . Acute pain of right knee Yes  . Acute gout of right knee, unspecified cause    I feel he had a significant strain of the right knee.  He is better.  The gout is an incidental finding but it needs to be treated.  PLAN Call if any problems.  Precautions discussed.  Continue current medications. Begin the allopurinol.  Return to clinic 2 weeks   Return to work 12-05-16, full duty.  He may wear the brace as needed.  Electronically Signed Darreld McleanWayne Caeden Foots,  MD 8/1/20189:58 AM

## 2016-12-01 LAB — URIC ACID: URIC ACID, SERUM: 4.5 mg/dL (ref 4.0–8.0)

## 2016-12-14 ENCOUNTER — Ambulatory Visit (INDEPENDENT_AMBULATORY_CARE_PROVIDER_SITE_OTHER): Payer: Worker's Compensation | Admitting: Orthopaedic Surgery

## 2016-12-14 ENCOUNTER — Encounter: Payer: Self-pay | Admitting: Orthopaedic Surgery

## 2016-12-14 VITALS — BP 108/76 | HR 76 | Temp 97.4°F | Ht 68.5 in | Wt 302.0 lb

## 2016-12-14 DIAGNOSIS — M25561 Pain in right knee: Secondary | ICD-10-CM | POA: Diagnosis not present

## 2016-12-14 NOTE — Patient Instructions (Signed)
Steps to Quit Smoking Smoking tobacco can be bad for your health. It can also affect almost every organ in your body. Smoking puts you and people around you at risk for many serious long-lasting (chronic) diseases. Quitting smoking is hard, but it is one of the best things that you can do for your health. It is never too late to quit. What are the benefits of quitting smoking? When you quit smoking, you lower your risk for getting serious diseases and conditions. They can include:  Lung cancer or lung disease.  Heart disease.  Stroke.  Heart attack.  Not being able to have children (infertility).  Weak bones (osteoporosis) and broken bones (fractures).  If you have coughing, wheezing, and shortness of breath, those symptoms may get better when you quit. You may also get sick less often. If you are pregnant, quitting smoking can help to lower your chances of having a baby of low birth weight. What can I do to help me quit smoking? Talk with your doctor about what can help you quit smoking. Some things you can do (strategies) include:  Quitting smoking totally, instead of slowly cutting back how much you smoke over a period of time.  Going to in-person counseling. You are more likely to quit if you go to many counseling sessions.  Using resources and support systems, such as: ? Online chats with a counselor. ? Phone quitlines. ? Printed self-help materials. ? Support groups or group counseling. ? Text messaging programs. ? Mobile phone apps or applications.  Taking medicines. Some of these medicines may have nicotine in them. If you are pregnant or breastfeeding, do not take any medicines to quit smoking unless your doctor says it is okay. Talk with your doctor about counseling or other things that can help you.  Talk with your doctor about using more than one strategy at the same time, such as taking medicines while you are also going to in-person counseling. This can help make  quitting easier. What things can I do to make it easier to quit? Quitting smoking might feel very hard at first, but there is a lot that you can do to make it easier. Take these steps:  Talk to your family and friends. Ask them to support and encourage you.  Call phone quitlines, reach out to support groups, or work with a counselor.  Ask people who smoke to not smoke around you.  Avoid places that make you want (trigger) to smoke, such as: ? Bars. ? Parties. ? Smoke-break areas at work.  Spend time with people who do not smoke.  Lower the stress in your life. Stress can make you want to smoke. Try these things to help your stress: ? Getting regular exercise. ? Deep-breathing exercises. ? Yoga. ? Meditating. ? Doing a body scan. To do this, close your eyes, focus on one area of your body at a time from head to toe, and notice which parts of your body are tense. Try to relax the muscles in those areas.  Download or buy apps on your mobile phone or tablet that can help you stick to your quit plan. There are many free apps, such as QuitGuide from the CDC (Centers for Disease Control and Prevention). You can find more support from smokefree.gov and other websites.  This information is not intended to replace advice given to you by your health care provider. Make sure you discuss any questions you have with your health care provider. Document Released: 02/12/2009 Document   Revised: 12/15/2015 Document Reviewed: 09/02/2014 Elsevier Interactive Patient Education  2018 Elsevier Inc.  

## 2016-12-14 NOTE — Progress Notes (Signed)
Patient VW:UJWJXBJ:George Gallagher, male DOB:[birth year], 41 y.o. [id number]  Chief Complaint  Patient presents with  . Follow-up    right knee pain    HPI  George Gallagher is a 41 y.o. male who has knee pain on the right.  He has more pain lateral and posterior over the hamstring area and insertion.  He has no new trauma.  He is working.  He has some swelling as the day goes on.  The knee sleeve we gave him slides down and he wants one that will stay up.  I have given Rx for him to get one, hinged.  I have gone over exercises and stretching activities to do. I have recommended Aspercreme to the area and ice.  He is to continue work. HPI  Body mass index is 45.25 kg/m.  ROS  Review of Systems  HENT: Negative for congestion.   Respiratory: Negative for cough and shortness of breath.   Cardiovascular: Negative for chest pain and leg swelling.  Endocrine: Negative for cold intolerance.  Musculoskeletal: Positive for arthralgias, gait problem and joint swelling.  Allergic/Immunologic: Negative for environmental allergies.    Past Medical History:  Diagnosis Date  . Chronic back pain     Past Surgical History:  Procedure Laterality Date  . KNEE SURGERY    . SHOULDER SURGERY    . TONSILLECTOMY    . VASECTOMY      Family History  Problem Relation Age of Onset  . Diabetes Maternal Grandfather     Social History Social History  Substance Use Topics  . Smoking status: Current Some Day Smoker  . Smokeless tobacco: Never Used  . Alcohol use Yes     Comment: occ    No Known Allergies  Current Outpatient Prescriptions  Medication Sig Dispense Refill  . allopurinol (ZYLOPRIM) 300 MG tablet Take 1 tablet (300 mg total) by mouth daily. 30 tablet 5  . ibuprofen (ADVIL,MOTRIN) 600 MG tablet Take 1 tablet (600 mg total) by mouth every 6 (six) hours as needed. 30 tablet 0  . tamsulosin (FLOMAX) 0.4 MG CAPS capsule Take 0.4 mg by mouth daily.  5   No current facility-administered  medications for this visit.      Physical Exam  Blood pressure 108/76, pulse 76, temperature (!) 97.4 F (36.3 C), height 5' 8.5" (1.74 m), weight (!) 302 lb (137 kg).  Constitutional: overall normal hygiene, normal nutrition, well developed, normal grooming, normal body habitus. Assistive device:knee sleeve right  Musculoskeletal: gait and station Limp none, muscle tone and strength are normal, no tremors or atrophy is present.  .  Neurological: coordination overall normal.  Deep tendon reflex/nerve stretch intact.  Sensation normal.  Cranial nerves II-XII intact.   Skin:   Normal overall no scars, lesions, ulcers or rashes. No psoriasis.  Psychiatric: Alert and oriented x 3.  Recent memory intact, remote memory unclear.  Normal mood and affect. Well groomed.  Good eye contact.  Cardiovascular: overall no swelling, no varicosities, no edema bilaterally, normal temperatures of the legs and arms, no clubbing, cyanosis and good capillary refill.  Lymphatic: palpation is normal.  His right knee has tenderness laterally with no effusion today. ROM is full but tender laterally.  He has pain over the lateral hamstring distally and at its insertion. He has no redness.  NV intact.  The knee is stable.  The patient has been educated about the nature of the problem(s) and counseled on treatment options.  The patient appeared to understand  what I have discussed and is in agreement with it.  Encounter Diagnosis  Name Primary?  . Acute pain of right knee Yes    PLAN Call if any problems.  Precautions discussed.  Continue current medications.   Return to clinic 1 month  His uric acid was 4.9,normal.  He is to continue the allopurinol.  Electronically Signed Darreld Mclean, MD 8/15/20188:50 AM

## 2017-01-17 ENCOUNTER — Encounter: Payer: Self-pay | Admitting: Orthopaedic Surgery

## 2017-01-17 ENCOUNTER — Ambulatory Visit (INDEPENDENT_AMBULATORY_CARE_PROVIDER_SITE_OTHER): Payer: Worker's Compensation | Admitting: Orthopaedic Surgery

## 2017-01-17 VITALS — BP 115/80 | HR 73 | Temp 97.4°F | Ht 68.5 in | Wt 302.0 lb

## 2017-01-17 DIAGNOSIS — G8929 Other chronic pain: Secondary | ICD-10-CM | POA: Diagnosis not present

## 2017-01-17 DIAGNOSIS — M25561 Pain in right knee: Secondary | ICD-10-CM | POA: Diagnosis not present

## 2017-01-17 MED ORDER — NAPROXEN 500 MG PO TABS
500.0000 mg | ORAL_TABLET | Freq: Two times a day (BID) | ORAL | 5 refills | Status: DC
Start: 2017-01-17 — End: 2022-01-12

## 2017-01-17 MED ORDER — PREDNISONE 5 MG (21) PO TBPK: ORAL_TABLET | ORAL | 0 refills | Status: DC

## 2017-01-17 NOTE — Progress Notes (Signed)
Patient George Gallagher, male DOB:24-Oct-1975, 41 y.o. OZD:[id number]  Chief Complaint  Patient presents with  . Follow-up    Right knee    HPI  [name] is a 41 y.o. male who has continued pain of the right knee.  He injured this on the job two months ago.  He has had rest, medicine.  MRI was negative.  He has good and bad days.  He has pain now on each side of the right knee with no giving way and no locking.  He has no new trauma.  He has tried ice, heat, rubs with little help.He has been on ibuprofen.  There is a history of gout.  I will stop his ibuprofen.  I will begin prednisone dose pack, then begin Naprosyn after dose pack is finished.  Return in three weeks. HPI  Body mass index is 45.25 kg/m.  ROS  Review of Systems  HENT: Negative for congestion.   Respiratory: Negative for cough and shortness of breath.   Cardiovascular: Negative for chest pain and leg swelling.  Endocrine: Negative for cold intolerance.  Musculoskeletal: Positive for arthralgias, gait problem and joint swelling.  Allergic/Immunologic: Negative for environmental allergies.    Past Medical History:  Diagnosis Date  . Chronic back pain     Past Surgical History:  Procedure Laterality Date  . KNEE SURGERY    . SHOULDER SURGERY    . TONSILLECTOMY    . VASECTOMY      Family History  Problem Relation Age of Onset  . Diabetes Maternal Grandfather     Social History Social History  Substance Use Topics  . Smoking status: Current Some Day Smoker  . Smokeless tobacco: Never Used  . Alcohol use Yes     Comment: occ    No Known Allergies  Current Outpatient Prescriptions  Medication Sig Dispense Refill  . allopurinol (ZYLOPRIM) 300 MG tablet Take 1 tablet (300 mg total) by mouth daily. 30 tablet 5  . ibuprofen (ADVIL,MOTRIN) 600 MG tablet Take 1 tablet (600 mg total) by mouth every 6 (six) hours as needed. 30 tablet 0  . tamsulosin (FLOMAX) 0.4 MG CAPS capsule Take 0.4 mg by  mouth daily.  5   No current facility-administered medications for this visit.      Physical Exam  Blood pressure 115/80, pulse 73, temperature (!) 97.4 F (36.3 C), height 5' 8.5" (1.74 m), weight (!) 302 lb (137 kg).  Constitutional: overall normal hygiene, normal nutrition, well developed, normal grooming, normal body habitus. Assistive device:none  Musculoskeletal: gait and station Limp none, muscle tone and strength are normal, no tremors or atrophy is present.  .  Neurological: coordination overall normal.  Deep tendon reflex/nerve stretch intact.  Sensation normal.  Cranial nerves II-XII intact.   Skin:   Normal overall no scars, lesions, ulcers or rashes. No psoriasis.  Psychiatric: Alert and oriented x 3.  Recent memory intact, remote memory unclear.  Normal mood and affect. Well groomed.  Good eye contact.  Cardiovascular: overall no swelling, no varicosities, no edema bilaterally, normal temperatures of the legs and arms, no clubbing, cyanosis and good capillary refill.  Lymphatic: palpation is normal.  All other systems reviewed and are negative   The right lower extremity is examined:  Inspection:  Thigh:  Non-tender and no defects  Knee has swelling 1/2 effusion.                        Joint tenderness  is present                        Patient is tender over the medial joint line  Lower Leg:  Has normal appearance and no tenderness or defects  Ankle:  Non-tender and no defects  Foot:  Non-tender and no defects Range of Motion:  Knee:  Range of motion is: 0-115                        Crepitus is  present  Ankle:  Range of motion is normal. Strength and Tone:  The right lower extremity has normal strength and tone. Stability:  Knee:  The knee is stable.  Ankle:  The ankle is stable.   The patient has been educated about the nature of the problem(s) and counseled on treatment options.  The patient appeared to understand what I have discussed and is in  agreement with it.  Encounter Diagnosis  Name Primary?  . Chronic pain of right knee Yes    PLAN Call if any problems.  Precautions discussed. Change in medications.   Return to clinic 3 weeks   Electronically Signed Darreld Mclean, MD 9/18/20189:20 AM

## 2017-02-07 ENCOUNTER — Ambulatory Visit (INDEPENDENT_AMBULATORY_CARE_PROVIDER_SITE_OTHER): Payer: Worker's Compensation | Admitting: Orthopaedic Surgery

## 2017-02-07 ENCOUNTER — Other Ambulatory Visit: Payer: Self-pay | Admitting: Orthopaedic Surgery

## 2017-02-07 ENCOUNTER — Encounter: Payer: Self-pay | Admitting: Orthopaedic Surgery

## 2017-02-07 VITALS — BP 112/74 | HR 72 | Temp 98.0°F | Ht 68.5 in | Wt 305.0 lb

## 2017-02-07 DIAGNOSIS — M25561 Pain in right knee: Secondary | ICD-10-CM

## 2017-02-07 DIAGNOSIS — G8929 Other chronic pain: Secondary | ICD-10-CM

## 2017-02-07 MED ORDER — PREDNISONE 5 MG (21) PO TBPK: ORAL_TABLET | ORAL | 0 refills | Status: DC

## 2017-02-07 NOTE — Progress Notes (Signed)
Patient George Gallagher:George Gallagher, male DOB:November 02, 1975, 41 y.o. [id number]  Chief Complaint  Patient presents with  . Follow-up    Right knee pain    HPI  [name] is a 41 y.o. male who continues to have right knee pain.  He has had negative MRI.  He has more lateral pain.  He has been using knee brace and needs a new one.  He has no giving way.  He has no swelling.  He said the prednisone helped a lot but the Naprosyn only helps a little.  He is taking his medicine and working regularly. HPI  Body mass index is 45.7 kg/m.  ROS  Review of Systems  HENT: Negative for congestion.   Respiratory: Negative for cough and shortness of breath.   Cardiovascular: Negative for chest pain and leg swelling.  Endocrine: Negative for cold intolerance.  Musculoskeletal: Positive for arthralgias, gait problem and joint swelling.  Allergic/Immunologic: Negative for environmental allergies.    Past Medical History:  Diagnosis Date  . Chronic back pain     Past Surgical History:  Procedure Laterality Date  . KNEE SURGERY    . SHOULDER SURGERY    . TONSILLECTOMY    . VASECTOMY      Family History  Problem Relation Age of Onset  . Diabetes Maternal Grandfather     Social History Social History  Substance Use Topics  . Smoking status: Current Some Day Smoker  . Smokeless tobacco: Never Used  . Alcohol use Yes     Comment: occ    No Known Allergies  Current Outpatient Prescriptions  Medication Sig Dispense Refill  . allopurinol (ZYLOPRIM) 300 MG tablet Take 1 tablet (300 mg total) by mouth daily. 30 tablet 5  . naproxen (NAPROSYN) 500 MG tablet Take 1 tablet (500 mg total) by mouth 2 (two) times daily with a meal. 60 tablet 5  . predniSONE (STERAPRED UNI-PAK 21 TAB) 5 MG (21) TBPK tablet Take 6 pills first day; 5 pills second day; 4 pills third day; 3 pills fourth day; 2 pills next day and 1 pill last day. Do not take the Naprosyn until this medicine is finished. 21 tablet 0   . tamsulosin (FLOMAX) 0.4 MG CAPS capsule Take 0.4 mg by mouth daily.  5   No current facility-administered medications for this visit.      Physical Exam  Blood pressure 112/74, pulse 72, temperature 98 F (36.7 C), height 5' 8.5" (1.74 m), weight (!) 305 lb (138.3 kg).  Constitutional: overall normal hygiene, normal nutrition, well developed, normal grooming, normal body habitus. Assistive device:none  Musculoskeletal: gait and station Limp right slightly, muscle tone and strength are normal, no tremors or atrophy is present.  .  Neurological: coordination overall normal.  Deep tendon reflex/nerve stretch intact.  Sensation normal.  Cranial nerves II-XII intact.   Skin:   Normal overall no scars, lesions, ulcers or rashes. No psoriasis.  Psychiatric: Alert and oriented x 3.  Recent memory intact, remote memory unclear.  Normal mood and affect. Well groomed.  Good eye contact.  Cardiovascular: overall no swelling, no varicosities, no edema bilaterally, normal temperatures of the legs and arms, no clubbing, cyanosis and good capillary refill.  Lymphatic: palpation is normal.  All other systems reviewed and are negative   The right lower extremity is examined:  Inspection:  Thigh:  Non-tender and no defects  Knee has swelling 1/2+ effusion.  Joint tenderness is present                        Patient is not tender over the medial joint line  Lower Leg:  Has normal appearance and no tenderness or defects  Ankle:  Non-tender and no defects  Foot:  Non-tender and no defects Range of Motion:  Knee:  Range of motion is: 0-115                        Crepitus is  present  Ankle:  Range of motion is normal. Strength and Tone:  The right lower extremity has normal strength and tone. Stability:  Knee:  The knee is stable.  Ankle:  The ankle is stable.   The patient has been educated about the nature of the problem(s) and counseled on treatment options.   The patient appeared to understand what I have discussed and is in agreement with it.  Encounter Diagnosis  Name Primary?  . Chronic pain of right knee Yes    PLAN Call if any problems.  Precautions discussed.  Continue current medications. Continue regular work.  Return to clinic 6 weeks   I will give new Rx for prednisone.  Do not take with the Naprosyn.    New brace fitted.  Electronically Signed Darreld Mclean, MD 10/9/20188:55 AM

## 2017-02-28 ENCOUNTER — Encounter: Payer: Self-pay | Admitting: Orthopaedic Surgery

## 2017-03-21 ENCOUNTER — Encounter: Payer: Self-pay | Admitting: Orthopaedic Surgery

## 2017-03-21 ENCOUNTER — Ambulatory Visit (INDEPENDENT_AMBULATORY_CARE_PROVIDER_SITE_OTHER): Payer: Worker's Compensation | Admitting: Orthopaedic Surgery

## 2017-03-21 VITALS — BP 143/89 | HR 75 | Temp 97.0°F | Ht 69.0 in | Wt 306.0 lb

## 2017-03-21 DIAGNOSIS — G8929 Other chronic pain: Secondary | ICD-10-CM | POA: Diagnosis not present

## 2017-03-21 DIAGNOSIS — M25561 Pain in right knee: Secondary | ICD-10-CM | POA: Diagnosis not present

## 2017-03-21 NOTE — Progress Notes (Signed)
Patient QM:VHQIONG:George Gallagher, male DOB:1975-05-24, 41 y.o. EXB:[id number]  Chief Complaint  Patient presents with  . Knee Pain    left     HPI  George Gallagher is a 41 y.o. male who has chronic pain with the right knee secondary to [employer] injury.  He is better.  He has some good and bad days, more good. He has no new injury to the right knee.  He has no swelling, no locking.  He is working full time. HPI  Body mass index is 45.19 kg/m.  ROS  Review of Systems  HENT: Negative for congestion.   Respiratory: Negative for cough and shortness of breath.   Cardiovascular: Negative for chest pain and leg swelling.  Endocrine: Negative for cold intolerance.  Musculoskeletal: Positive for arthralgias, gait problem and joint swelling.  Allergic/Immunologic: Negative for environmental allergies.    Past Medical History:  Diagnosis Date  . Chronic back pain     Past Surgical History:  Procedure Laterality Date  . KNEE SURGERY    . SHOULDER SURGERY    . TONSILLECTOMY    . VASECTOMY      Family History  Problem Relation Age of Onset  . Diabetes Maternal Grandfather     Social History Social History   Tobacco Use  . Smoking status: Current Some Day Smoker  . Smokeless tobacco: Never Used  Substance Use Topics  . Alcohol use: Yes    Comment: occ  . Drug use: No    No Known Allergies  Current Outpatient Medications  Medication Sig Dispense Refill  . allopurinol (ZYLOPRIM) 300 MG tablet Take 1 tablet (300 mg total) by mouth daily. (Patient not taking: Reported on 03/21/2017) 30 tablet 5  . naproxen (NAPROSYN) 500 MG tablet Take 1 tablet (500 mg total) by mouth 2 (two) times daily with a meal. (Patient not taking: Reported on 03/21/2017) 60 tablet 5  . tamsulosin (FLOMAX) 0.4 MG CAPS capsule Take 0.4 mg by mouth daily.  5   No current facility-administered medications for this visit.      Physical Exam  Blood pressure (!) 143/89, pulse 75, temperature  (!) 97 F (36.1 C), height 5\' 9"  (1.753 m), weight (!) 306 lb (138.8 kg).  Constitutional: overall normal hygiene, normal nutrition, well developed, normal grooming, normal body habitus. Assistive device:none  Musculoskeletal: gait and station Limp none, muscle tone and strength are normal, no tremors or atrophy is present.  .  Neurological: coordination overall normal.  Deep tendon reflex/nerve stretch intact.  Sensation normal.  Cranial nerves II-XII intact.   Skin:   Normal overall no scars, lesions, ulcers or rashes. No psoriasis.  Psychiatric: Alert and oriented x 3.  Recent memory intact, remote memory unclear.  Normal mood and affect. Well groomed.  Good eye contact.  Cardiovascular: overall no swelling, no varicosities, no edema bilaterally, normal temperatures of the legs and arms, no clubbing, cyanosis and good capillary refill.  Lymphatic: palpation is normal.  All other systems reviewed and are negative   Right knee has full ROM, no pain, no effusion, negative exam.  The patient has been educated about the nature of the problem(s) and counseled on treatment options.  The patient appeared to understand what I have discussed and is in agreement with it.  Encounter Diagnosis  Name Primary?  . Chronic pain of right knee Yes    PLAN Call if any problems.  Precautions discussed.  Continue current medications.   Return to clinic PRN  He  is at maximum medical improvement.  He will have a 0%, ZERO percent, impairment of the right knee.  I will see as needed.  Electronically Signed Darreld McleanWayne Cara Thaxton, MD 11/20/201810:19 AM

## 2017-10-16 IMAGING — DX DG KNEE COMPLETE 4+V*R*
4 series · 4 of 4 positions shown · non-contrast
Comparison: 10/26/2013

CLINICAL DATA: Right knee pain after lifting items on [REDACTED] and
heard a pop. Pain since then.

EXAM:
RIGHT KNEE - COMPLETE 4+ VIEW

[knee ap]
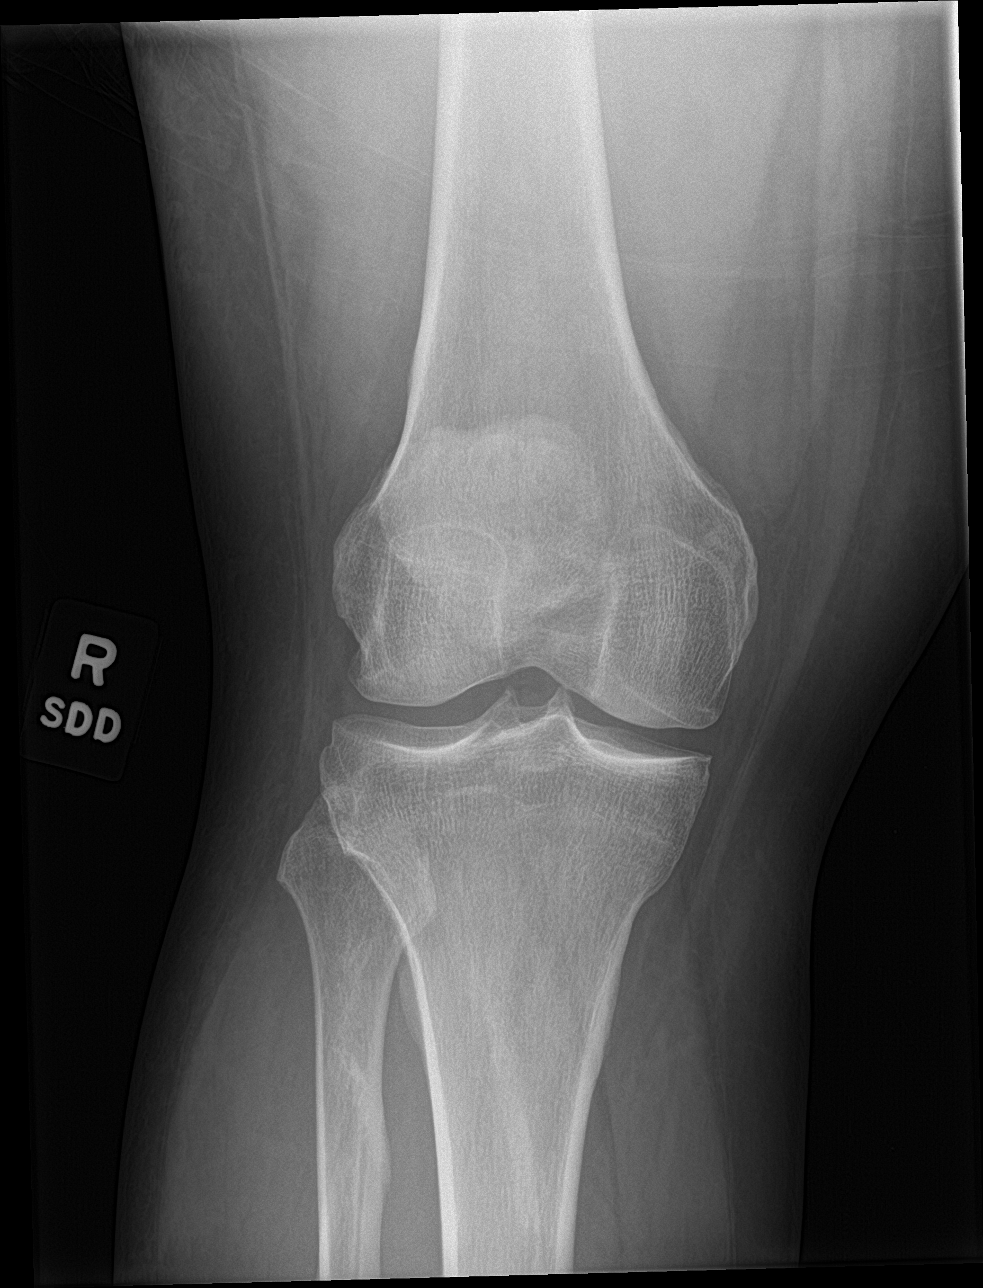

[tunnel]
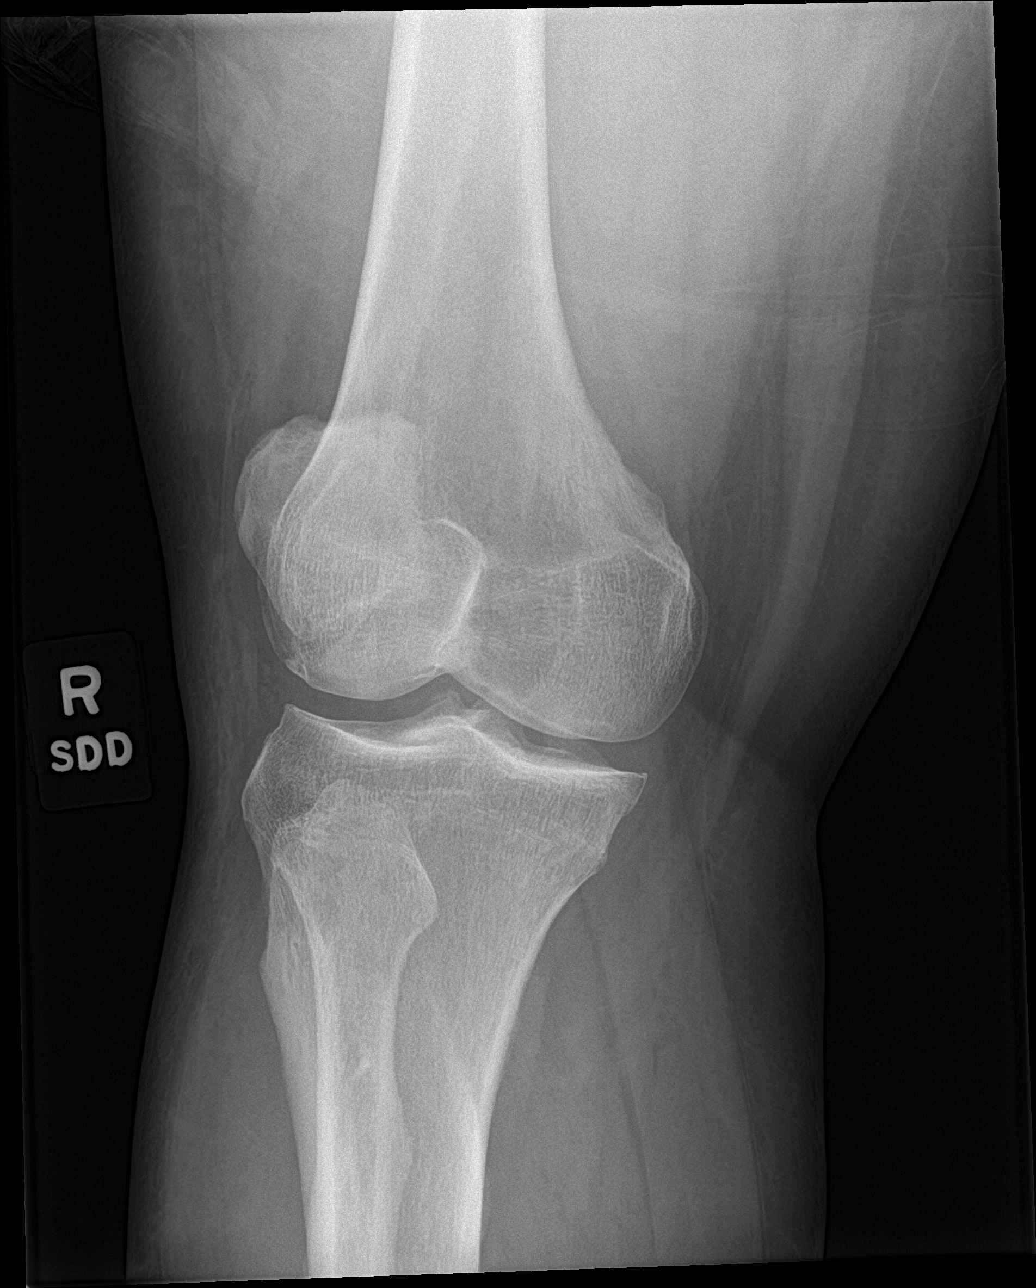

[knee lat]
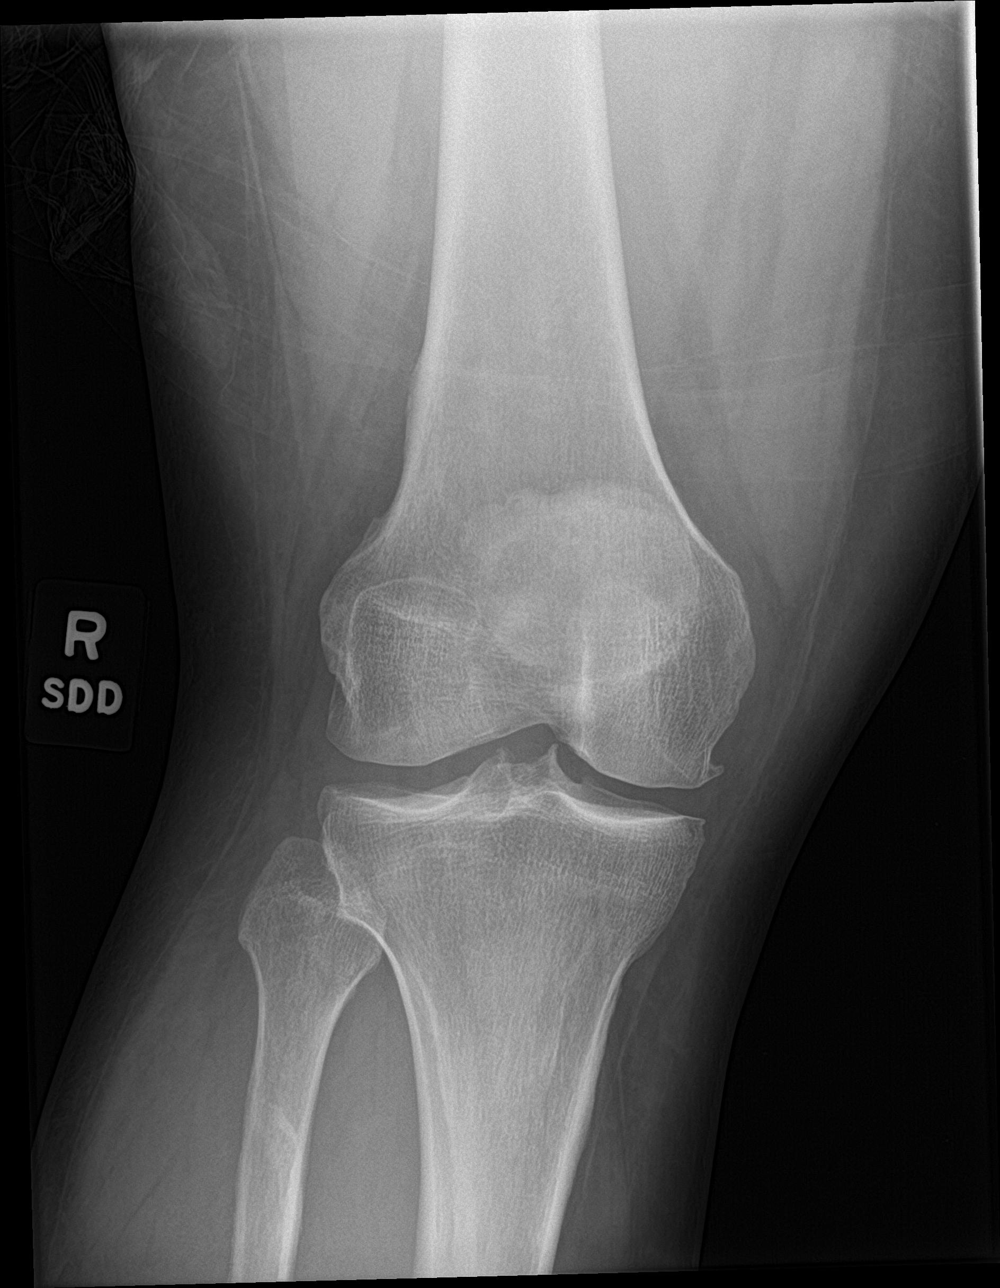

[knee sunrise]
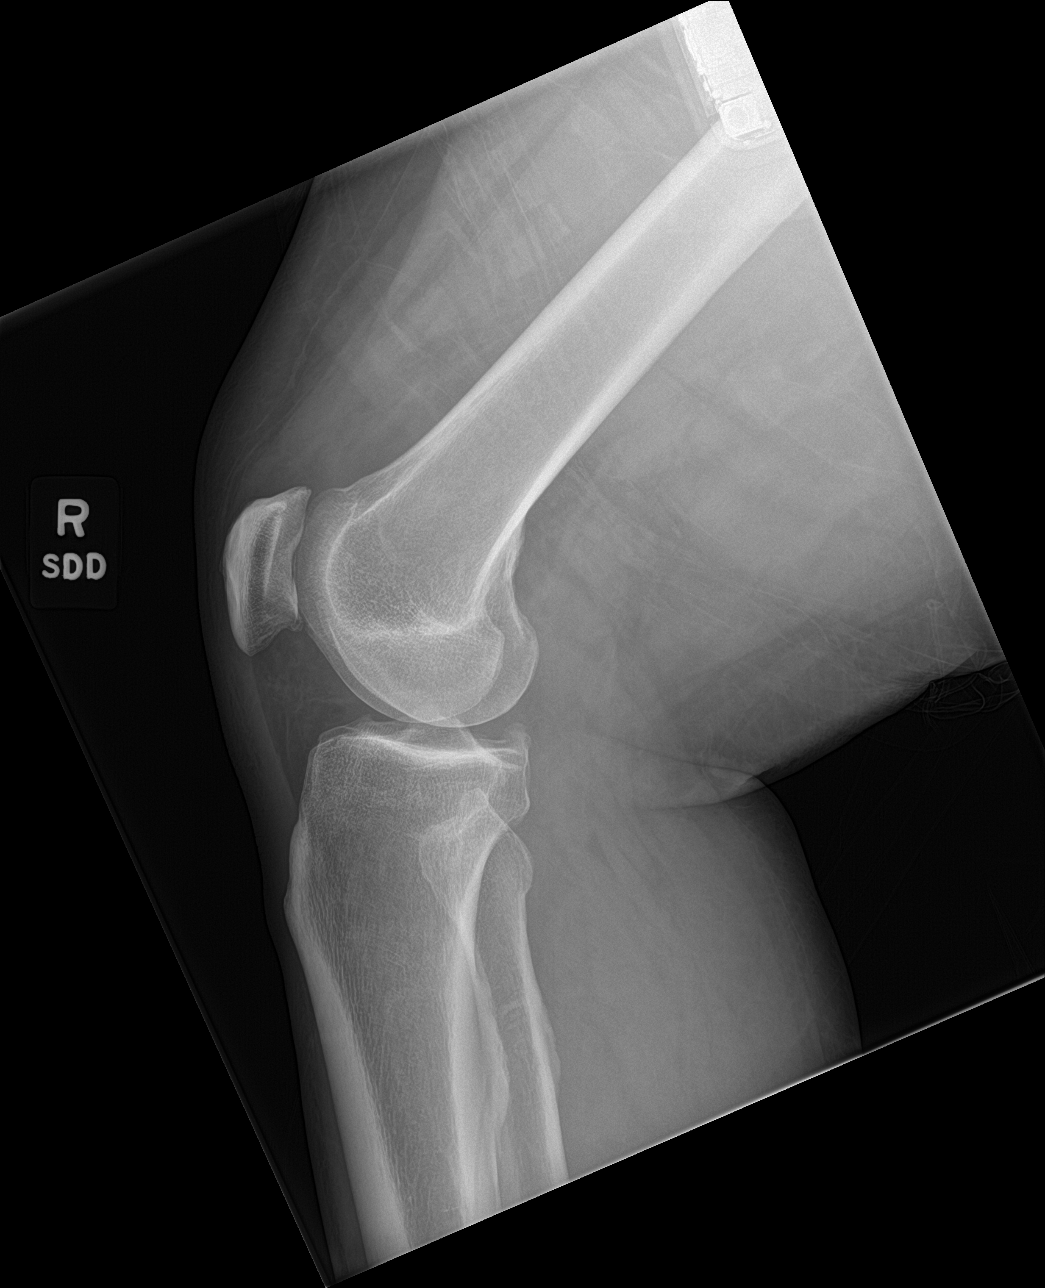

[4 of 4 positions shown; findings below may reference images not displayed]

FINDINGS: Tricompartmental osteoarthritic joint space narrowing and spurring
is noted. Minimal spurring of the tibial eminence bilaterally. Trace
joint effusion. No acute appearing fracture or dislocations. Soft
tissue, meniscal and ligamentous injuries would be better assessed
with nonemergent MRI if clinically necessary.
IMPRESSION: Mild progression of tricompartmental osteoarthritis with trace joint
effusion. No acute fracture nor dislocations noted.

## 2019-07-13 ENCOUNTER — Ambulatory Visit: Payer: Self-pay | Attending: Internal Medicine

## 2019-07-13 DIAGNOSIS — Z23 Encounter for immunization: Secondary | ICD-10-CM

## 2019-07-13 NOTE — Progress Notes (Signed)
   Covid-19 Vaccination Clinic  Name:  George Gallagher    MRN: [id number] DOB: 03-Dec-1975  07/13/2019  [name] was observed post Covid-19 immunization for 15 minutes without incident. He was provided with Vaccine Information Sheet and instruction to access the V-Safe system.   [name] was instructed to call 911 with any severe reactions post vaccine: Marland Kitchen Difficulty breathing  . Swelling of face and throat  . A fast heartbeat  . A bad rash all over body  . Dizziness and weakness   Immunizations Administered    Name Date Dose VIS Date Route   Pfizer COVID-19 Vaccine 07/13/2019  8:26 AM 0.3 mL 04/12/2019 Intramuscular   Manufacturer: [employer], [employer]   Lot: [id number]   NDC: [address]-2

## 2019-08-05 ENCOUNTER — Ambulatory Visit: Payer: Self-pay

## 2019-08-05 ENCOUNTER — Ambulatory Visit: Payer: Self-pay | Attending: Internal Medicine

## 2019-08-05 DIAGNOSIS — Z23 Encounter for immunization: Secondary | ICD-10-CM

## 2019-08-05 NOTE — Progress Notes (Signed)
   Covid-19 Vaccination Clinic  Name:  George Gallagher    MRN: [id number] DOB: 1976-04-03  08/05/2019  George Gallagher was observed post Covid-19 immunization for 15 minutes without incident. He was provided with Vaccine Information Sheet and instruction to access the V-Safe system.   George Gallagher was instructed to call 911 with any severe reactions post vaccine: Marland Kitchen Difficulty breathing  . Swelling of face and throat  . A fast heartbeat  . A bad rash all over body  . Dizziness and weakness   Immunizations Administered    Name Date Dose VIS Date Route   Pfizer COVID-19 Vaccine 08/05/2019  1:32 PM 0.3 mL 04/12/2019 Intramuscular   Manufacturer: [employer], [employer]   Lot: [id number]   NDC: [address]-4

## 2021-11-09 ENCOUNTER — Encounter: Payer: Self-pay | Admitting: Internal Medicine

## 2021-12-07 ENCOUNTER — Telehealth: Payer: Self-pay

## 2021-12-07 ENCOUNTER — Encounter: Payer: Self-pay | Admitting: *Deleted

## 2021-12-07 ENCOUNTER — Ambulatory Visit (INDEPENDENT_AMBULATORY_CARE_PROVIDER_SITE_OTHER): Payer: Self-pay | Admitting: Gastroenterology

## 2021-12-07 ENCOUNTER — Encounter: Payer: Self-pay | Admitting: Gastroenterology

## 2021-12-07 ENCOUNTER — Telehealth: Payer: Self-pay | Admitting: *Deleted

## 2021-12-07 DIAGNOSIS — K6289 Other specified diseases of anus and rectum: Secondary | ICD-10-CM

## 2021-12-07 DIAGNOSIS — K625 Hemorrhage of anus and rectum: Secondary | ICD-10-CM

## 2021-12-07 MED ORDER — PEG 3350-KCL-NA BICARB-NACL 420 G PO SOLR: 4000.0000 mL | Freq: Once | ORAL | 0 refills | Status: AC

## 2021-12-07 NOTE — Telephone Encounter (Signed)
Pt called and LMOVM on vm regarding his Rx being sent in. He stated he called and you had not called it in. Please advise

## 2021-12-07 NOTE — Telephone Encounter (Signed)
Phoned and advised the pt of the result note and pt expressed understanding

## 2021-12-07 NOTE — H&P (View-Only) (Signed)
Gastroenterology Office Note    Referring Provider: Lovey Newcomer, PA Primary Care Physician:  Lovey Newcomer, PA  Primary GI: Dr. Marletta Lor   Chief Complaint   Chief Complaint  Patient presents with   New Patient (Initial Visit)    Hemorrhoids and rectal pain. Pt having rectal pain is a 10. Pt has had creams and suppositories with nitroglycerin and lidocaine and still not helping.     History of Present Illness   George Gallagher is a 46 y.o. male presenting today at the request of Lovey Newcomer, [place] due to rectal pain. No prior colonoscopy.    Lots of burning. Feels like suppositories made things worse. Nitroglycerine in the cream. Suppositories just with lidocaine. [facility] compounded. Used nitro and suppostiory for about a week. Was using nitro one time a day. When stopping this felt better. Feels gritty with BM at times. Burning is biggest issue. No prolapsing. Headaches with nitro.   No prior colonoscopy. Low-volume bleeding once every 1-2 weeks. Taking Metamucil, which keeps stool soft. On toilet 5-10 minutes. Some straining.    In remote past had similar issues and used nitroglycerine. Symptoms going on for 3 months.   Past Medical History:  Diagnosis Date   Chronic back pain     Past Surgical History:  Procedure Laterality Date   KNEE SURGERY     SHOULDER SURGERY     TONSILLECTOMY     VASECTOMY      Current Outpatient Medications  Medication Sig Dispense Refill   atorvastatin (LIPITOR) 10 MG tablet Take 10 mg by mouth daily.     allopurinol (ZYLOPRIM) 300 MG tablet Take 1 tablet (300 mg total) by mouth daily. (Patient not taking: Reported on 03/21/2017) 30 tablet 5   naproxen (NAPROSYN) 500 MG tablet Take 1 tablet (500 mg total) by mouth 2 (two) times daily with a meal. (Patient not taking: Reported on 03/21/2017) 60 tablet 5   tamsulosin (FLOMAX) 0.4 MG CAPS capsule Take 0.4 mg by mouth daily. (Patient not taking: Reported on 12/07/2021)  5   No current  facility-administered medications for this visit.    Allergies as of 12/07/2021   (No Known Allergies)    Family History  Problem Relation Age of Onset   Diabetes Maternal Grandfather    Colon cancer Neg Hx    Colon polyps Neg Hx     Social History   Socioeconomic History   Marital status: Married    Spouse name: Not on file   Number of children: Not on file   Years of education: Not on file   Highest education level: Not on file  Occupational History   Not on file  Tobacco Use   Smoking status: Some Days   Smokeless tobacco: Never  Substance and Sexual Activity   Alcohol use: Yes    Comment: occ   Drug use: No   Sexual activity: Not on file  Other Topics Concern   Not on file  Social History Narrative   Not on file   Social Determinants of Health   Financial Resource Strain: Not on file  Food Insecurity: Not on file  Transportation Needs: Not on file  Physical Activity: Not on file  Stress: Not on file  Social Connections: Not on file  Intimate Partner Violence: Not on file     Review of Systems   Gen: Denies any fever, chills, fatigue, weight loss, lack of appetite.  CV: Denies chest pain, heart palpitations,  peripheral edema, syncope.  Resp: Denies shortness of breath at rest or with exertion. Denies wheezing or cough.  GI: see HPI GU : Denies urinary burning, urinary frequency, urinary hesitancy MS: Denies joint pain, muscle weakness, cramps, or limitation of movement.  Derm: Denies rash, itching, dry skin Psych: Denies depression, anxiety, memory loss, and confusion Heme: Denies bruising, bleeding, and enlarged lymph nodes.   Physical Exam   BP 129/77   Pulse 71   Temp (!) 97.5 F (36.4 C)   Ht 5\' 9"  (1.753 m)   Wt (!) 310 lb 12.8 oz (141 kg)   BMI 45.90 kg/m  General:   Alert and oriented. Pleasant and cooperative. Well-nourished and well-developed.  Head:  Normocephalic and atraumatic. Eyes:  Without icterus Ears:  Normal auditory  acuity. Lungs:  Clear to auscultation bilaterally.  Heart:  S1, S2 present without murmurs appreciated.  Abdomen:  +BS, soft, non-tender and non-distended. No HSM noted. No guarding or rebound. No masses appreciated.  Rectal:  anoscopy with right posterior ulcerated column and right anterior hemorrhoid prominent.  Msk:  Symmetrical without gross deformities. Normal posture. Extremities:  Without edema. Neurologic:  Alert and  oriented x4;  grossly normal neurologically. Skin:  Intact without significant lesions or rashes. Psych:  Alert and cooperative. Normal mood and affect.   Assessment   George Gallagher is a 46 y.o. male presenting today with rectal pain and bleeding at the request of 56, [place].  On anoscopy, I was not able to visualize obvious fissure, but he did have what appears to be angry/ulcerated right posterior column and right anterior prominence of hemorrhoids predominantly. No obvious fissure. He did not have significant discomfort on exam, surprisingly.  We will treat with compounded [facility] cream and use diltiazem instead of nitro or nefidipine. Nitor caused headaches, and nifedepine was not helpful.   Arranging diagnostic colonoscopy in near future. No FH colon cancer or polyps. No prior colonoscopy.    PLAN    Diltiazem 2% with hydrocortisone 2.5% called into [place].   Proceed with colonoscopy by Dr. [place]  in near future: the risks, benefits, and alternatives have been discussed with the patient in detail. The patient states understanding and desires to proceed.   Continue Metamucil. Add stool softeners BID  Further recommendations to follow   Marletta Lor, PhD, ANP-BC [facility]

## 2021-12-07 NOTE — Telephone Encounter (Signed)
I called it in about an hour ago. Had to get a moment to do it. But, they should be calling him soon!

## 2021-12-07 NOTE — Patient Instructions (Signed)
Continue Metamucil daily. I recommend stool softeners (like docusate sodium) once to twice a day to help avoid straining.   I am calling in a compounded cream to [place]. They will call you when this is ready. Use this 4 times a day.   We are arranging a colonoscopy with Dr. Marletta Lor in the near future!  Please call if no improvement!  Further recommendations to follow  It was a pleasure to see you today. I want to create trusting relationships with patients to provide genuine, compassionate, and quality care. I value your feedback. If you receive a survey regarding your visit,  I greatly appreciate you taking time to fill this out.   Gelene Mink, PhD, ANP-BC [facility]

## 2021-12-07 NOTE — Progress Notes (Unsigned)
Gastroenterology Office Note    Referring Provider: Lovey Newcomer, PA Primary Care Physician:  Lovey Newcomer, PA  Primary GI: Dr. Marletta Lor   Chief Complaint   Chief Complaint  Patient presents with   New Patient (Initial Visit)    Hemorrhoids and rectal pain. Pt having rectal pain is a 10. Pt has had creams and suppositories with nitroglycerin and lidocaine and still not helping.     History of Present Illness   George Gallagher is a 46 y.o. male presenting today at the request of Lovey Newcomer, [place] due to   Lots of burning. Feels like suppositories made things worse. Nitroglycerine in the cream. Suppositories just with lidocaine. [facility] compounded. Used nitro and suppostiory for about a week. Was using nitro one time a day. When stopping this felt better. Feels gritty with BM at times. Burning is biggest issue. No prolapsing.   No prior colonoscopy. Low-volume bleeding once every 1-2 weeks. Taking Metamucil, which keeps stool soft. On toilet 5-10 minutes. Some straining.    In remote past had similar issues and used nitroglycerine. Symptoms going on for 3 months.   Past Medical History:  Diagnosis Date   Chronic back pain     Past Surgical History:  Procedure Laterality Date   KNEE SURGERY     SHOULDER SURGERY     TONSILLECTOMY     VASECTOMY      Current Outpatient Medications  Medication Sig Dispense Refill   allopurinol (ZYLOPRIM) 300 MG tablet Take 1 tablet (300 mg total) by mouth daily. (Patient not taking: Reported on 03/21/2017) 30 tablet 5   naproxen (NAPROSYN) 500 MG tablet Take 1 tablet (500 mg total) by mouth 2 (two) times daily with a meal. (Patient not taking: Reported on 03/21/2017) 60 tablet 5   tamsulosin (FLOMAX) 0.4 MG CAPS capsule Take 0.4 mg by mouth daily. (Patient not taking: Reported on 12/07/2021)  5   No current facility-administered medications for this visit.    Allergies as of 12/07/2021   (No Known Allergies)    Family  History  Problem Relation Age of Onset   Diabetes Maternal Grandfather     Social History   Socioeconomic History   Marital status: Married    Spouse name: Not on file   Number of children: Not on file   Years of education: Not on file   Highest education level: Not on file  Occupational History   Not on file  Tobacco Use   Smoking status: Some Days   Smokeless tobacco: Never  Substance and Sexual Activity   Alcohol use: Yes    Comment: occ   Drug use: No   Sexual activity: Not on file  Other Topics Concern   Not on file  Social History Narrative   Not on file   Social Determinants of Health   Financial Resource Strain: Not on file  Food Insecurity: Not on file  Transportation Needs: Not on file  Physical Activity: Not on file  Stress: Not on file  Social Connections: Not on file  Intimate Partner Violence: Not on file     Review of Systems   Gen: Denies any fever, chills, fatigue, weight loss, lack of appetite.  CV: Denies chest pain, heart palpitations, peripheral edema, syncope.  Resp: Denies shortness of breath at rest or with exertion. Denies wheezing or cough.  GI: Denies dysphagia or odynophagia. Denies jaundice, hematemesis, fecal incontinence. GU : Denies urinary burning, urinary frequency, urinary hesitancy  MS: Denies joint pain, muscle weakness, cramps, or limitation of movement.  Derm: Denies rash, itching, dry skin Psych: Denies depression, anxiety, memory loss, and confusion Heme: Denies bruising, bleeding, and enlarged lymph nodes.   Physical Exam   BP 129/77   Pulse 71   Temp (!) 97.5 F (36.4 C)   Ht 5\' 9"  (1.753 m)   Wt (!) 310 lb 12.8 oz (141 kg)   BMI 45.90 kg/m  General:   Alert and oriented. Pleasant and cooperative. Well-nourished and well-developed.  Head:  Normocephalic and atraumatic. Eyes:  Without icterus Ears:  Normal auditory acuity. Lungs:  Clear to auscultation bilaterally.  Heart:  S1, S2 present without murmurs  appreciated.  Abdomen:  +BS, soft, non-tender and non-distended. No HSM noted. No guarding or rebound. No masses appreciated.  Rectal:  Deferred  Msk:  Symmetrical without gross deformities. Normal posture. Extremities:  Without edema. Neurologic:  Alert and  oriented x4;  grossly normal neurologically. Skin:  Intact without significant lesions or rashes. Psych:  Alert and cooperative. Normal mood and affect.   Assessment   George Gallagher is a 46 y.o. male presenting today with    PLAN    Diltiazem 2% with hydrocortisone 2.5% called into [place].    Gelene Mink, PhD, ANP-BC [facility]

## 2021-12-07 NOTE — Telephone Encounter (Signed)
Pt in office and was scheduled for TCS ASA 3 with Dr. Marletta Lor. Instructions printed, awaiting pre-op appt. Prep sent to the pharmacy.

## 2021-12-09 ENCOUNTER — Encounter: Payer: Self-pay | Admitting: Gastroenterology

## 2021-12-22 NOTE — Patient Instructions (Signed)
   Your procedure is scheduled on: 12/27/2021  Report to [facility] Main Entrance at   10:00  AM.  Call this number if you have problems the morning of surgery: [phone]   Remember:              Follow Directions on the letter you received from Your Physician's office regarding the Bowel Prep              No Smoking the day of Procedure :   Take these medicines the morning of surgery with A SIP OF WATER: none   Do not wear jewelry, make-up or nail polish.    Do not bring valuables to the hospital.  Contacts, dentures or bridgework may not be worn into surgery.  .   Patients discharged the day of surgery will not be allowed to drive home.     Colonoscopy, Adult, Care After This sheet gives you information about how to care for yourself after your procedure. Your health care provider may also give you more specific instructions. If you have problems or questions, contact your health care provider. What can I expect after the procedure? After the procedure, it is common to have: A small amount of blood in your stool for 24 hours after the procedure. Some gas. Mild abdominal cramping or bloating.  Follow these instructions at home: General instructions  For the first 24 hours after the procedure: Do not drive or use machinery. Do not sign important documents. Do not drink alcohol. Do your regular daily activities at a slower pace than normal. Eat soft, easy-to-digest foods. Rest often. Take over-the-counter or prescription medicines only as told by your health care provider. It is up to you to get the results of your procedure. Ask your health care provider, or the department performing the procedure, when your results will be ready. Relieving cramping and bloating Try walking around when you have cramps or feel bloated. Apply heat to your abdomen as told by your health care provider. Use a heat source that your health care provider recommends, such as a moist heat pack or a  heating pad. Place a towel between your skin and the heat source. Leave the heat on for 20-30 minutes. Remove the heat if your skin turns bright red. This is especially important if you are unable to feel pain, heat, or cold. You may have a greater risk of getting burned. Eating and drinking Drink enough fluid to keep your urine clear or pale yellow. Resume your normal diet as instructed by your health care provider. Avoid heavy or fried foods that are hard to digest. Avoid drinking alcohol for as long as instructed by your health care provider. Contact a health care provider if: You have blood in your stool 2-3 days after the procedure. Get help right away if: You have more than a small spotting of blood in your stool. You pass large blood clots in your stool. Your abdomen is swollen. You have nausea or vomiting. You have a fever. You have increasing abdominal pain that is not relieved with medicine. This information is not intended to replace advice given to you by your health care provider. Make sure you discuss any questions you have with your health care provider. Document Released: 12/01/2003 Document Revised: 01/11/2016 Document Reviewed: 06/30/2015 Elsevier Interactive Patient Education  [employer].

## 2021-12-23 ENCOUNTER — Encounter (HOSPITAL_COMMUNITY): Payer: Self-pay

## 2021-12-23 ENCOUNTER — Encounter (HOSPITAL_COMMUNITY)
Admission: RE | Admit: 2021-12-23 | Discharge: 2021-12-23 | Disposition: A | Discharge status: Home / Self Care | Payer: BC Managed Care – PPO | Source: Ambulatory Visit | Attending: Internal Medicine | Admitting: Internal Medicine

## 2021-12-27 ENCOUNTER — Encounter (HOSPITAL_COMMUNITY)
Admission: RE | Disposition: A | Discharge status: Home / Self Care | Payer: Self-pay | Source: Home / Self Care | Attending: Internal Medicine

## 2021-12-27 ENCOUNTER — Ambulatory Visit (HOSPITAL_COMMUNITY): Payer: BC Managed Care – PPO | Admitting: Anesthesiology

## 2021-12-27 ENCOUNTER — Ambulatory Visit (HOSPITAL_COMMUNITY)
Admission: RE | Admit: 2021-12-27 | Discharge: 2021-12-27 | Disposition: A | Discharge status: Home / Self Care | Payer: BC Managed Care – PPO | Attending: Internal Medicine | Admitting: Internal Medicine

## 2021-12-27 ENCOUNTER — Encounter (HOSPITAL_COMMUNITY): Payer: Self-pay

## 2021-12-27 DIAGNOSIS — K648 Other hemorrhoids: Secondary | ICD-10-CM | POA: Insufficient documentation

## 2021-12-27 DIAGNOSIS — K625 Hemorrhage of anus and rectum: Secondary | ICD-10-CM | POA: Insufficient documentation

## 2021-12-27 DIAGNOSIS — F17200 Nicotine dependence, unspecified, uncomplicated: Secondary | ICD-10-CM | POA: Diagnosis not present

## 2021-12-27 HISTORY — PX: COLONOSCOPY WITH PROPOFOL: SHX5780

## 2021-12-27 SURGERY — COLONOSCOPY WITH PROPOFOL
Anesthesia: General

## 2021-12-27 MED ORDER — LACTATED RINGERS IV SOLN: INTRAVENOUS | Status: DC | PRN

## 2021-12-27 MED ORDER — PROPOFOL 10 MG/ML IV BOLUS
INTRAVENOUS | Status: DC | PRN
  Administered 2021-12-27: 100 mg via INTRAVENOUS
  Administered 2021-12-27 (×4): 50 mg via INTRAVENOUS

## 2021-12-27 MED ORDER — LACTATED RINGERS IV SOLN: INTRAVENOUS | Status: DC

## 2021-12-27 NOTE — Discharge Instructions (Addendum)
  Colonoscopy Discharge Instructions  Read the instructions outlined below and refer to this sheet in the next few weeks. These discharge instructions provide you with general information on caring for yourself after you leave the hospital. Your doctor may also give you specific instructions. While your treatment has been planned according to the most current medical practices available, unavoidable complications occasionally occur.   ACTIVITY You may resume your regular activity, but move at a slower pace for the next 24 hours.  Take frequent rest periods for the next 24 hours.  Walking will help get rid of the air and reduce the bloated feeling in your belly (abdomen).  No driving for 24 hours (because of the medicine (anesthesia) used during the test).   Do not sign any important legal documents or operate any machinery for 24 hours (because of the anesthesia used during the test).  NUTRITION Drink plenty of fluids.  You may resume your normal diet as instructed by your doctor.  Begin with a light meal and progress to your normal diet. Heavy or fried foods are harder to digest and may make you feel sick to your stomach (nauseated).  Avoid alcoholic beverages for 24 hours or as instructed.  MEDICATIONS You may resume your normal medications unless your doctor tells you otherwise.  WHAT YOU CAN EXPECT TODAY Some feelings of bloating in the abdomen.  Passage of more gas than usual.  Spotting of blood in your stool or on the toilet paper.  IF YOU HAD POLYPS REMOVED DURING THE COLONOSCOPY: No aspirin products for 7 days or as instructed.  No alcohol for 7 days or as instructed.  Eat a soft diet for the next 24 hours.  FINDING OUT THE RESULTS OF YOUR TEST Not all test results are available during your visit. If your test results are not back during the visit, make an appointment with your caregiver to find out the results. Do not assume everything is normal if you have not heard from your  caregiver or the medical facility. It is important for you to follow up on all of your test results.  SEEK IMMEDIATE MEDICAL ATTENTION IF: You have more than a spotting of blood in your stool.  Your belly is swollen (abdominal distention).  You are nauseated or vomiting.  You have a temperature over 101.  You have abdominal pain or discomfort that is severe or gets worse throughout the day.   Your colonoscopy was relatively unremarkable.  I did not find any polyps or evidence of colon cancer.  I recommend repeating colonoscopy in 10 years for colon cancer screening purposes.  You do have internal hemorrhoids. I would recommend increasing fiber in your diet or adding OTC Benefiber/Metamucil. Be sure to drink at least 4 to 6 glasses of water daily.  We will schedule you for hemorrhoid banding with Lewie Loron next available.  I hope you have a great rest of your week!  Hennie Duos. Marletta Lor, D.O. Gastroenterology and Hepatology [facility]

## 2021-12-27 NOTE — Op Note (Signed)
[facility] Patient Name: George Gallagher Procedure Date: 12/27/2021 10:45 AM MRN: [id number] Date of Birth: Jul 14, 1975 Attending MD: Elon Alas. Abbey Chatters DO CSN: [id number] Age: 46 Admit Type: Outpatient Procedure:                Colonoscopy Indications:              Rectal bleeding, Rectal pain Providers:                Elon Alas. Abbey Chatters, DO, Caprice Kluver, Crystal Page,                            Ladoris Gene Technician, Technician Referring MD:              Medicines:                Monitored Anesthesia Care Complications:            No immediate complications. Estimated Blood Loss:     Estimated blood loss: none. Procedure:                Pre-Anesthesia Assessment:                           - The anesthesia plan was to use monitored                            anesthesia care (MAC).                           After obtaining informed consent, the colonoscope                            was passed under direct vision. Throughout the                            procedure, the patient's blood pressure, pulse, and                            oxygen saturations were monitored continuously. The                            PCF-HQ190L ([id number]) scope was introduced through                            the anus and advanced to the the cecum, identified                            by appendiceal orifice and ileocecal valve. The                            colonoscopy was performed without difficulty. The                            patient tolerated the procedure well. The quality                            of the bowel preparation was evaluated  using the                            BBPS [facility] Bowel Preparation Scale) with scores                            of: Right Colon = 2 (minor amount of residual                            staining, small fragments of stool and/or opaque                            liquid, but mucosa seen well), Transverse Colon = 2                            (minor amount of  residual staining, small fragments                            of stool and/or opaque liquid, but mucosa seen                            well) and Left Colon = 2 (minor amount of residual                            staining, small fragments of stool and/or opaque                            liquid, but mucosa seen well). The total BBPS score                            equals 6. The quality of the bowel preparation was                            fair. Scope In: 10:58:04 AM Scope Out: 11:08:45 AM Scope Withdrawal Time: 0 hours 8 minutes 51 seconds  Total Procedure Duration: 0 hours 10 minutes 41 seconds  Findings:      The perianal and digital rectal examinations were normal. No anal       fissure seen      Non-bleeding internal hemorrhoids were found during retroflexion.      The terminal ileum appeared normal.      The exam was otherwise without abnormality. Impression:               - Preparation of the colon was fair.                           - Non-bleeding internal hemorrhoids.                           - The examined portion of the ileum was normal.                           - The examination was otherwise normal.                           -  No specimens collected. Moderate Sedation:      Per Anesthesia Care Recommendation:           - Patient has a contact number available for                            emergencies. The signs and symptoms of potential                            delayed complications were discussed with the                            patient. Return to normal activities tomorrow.                            Written discharge instructions were provided to the                            patient.                           - Resume previous diet.                           - Continue present medications.                           - Repeat colonoscopy in 10 years for screening                            purposes.                           - Return to GI clinic at the  next available                            appointment with Roseanne Kaufman for hemorrhoid banding. Procedure Code(s):        --- Professional ---                           [phone], Colonoscopy, flexible; diagnostic, including                            collection of specimen(s) by brushing or washing,                            when performed (separate procedure) Diagnosis Code(s):        --- Professional ---                           K64.8, Other hemorrhoids                           K62.5, Hemorrhage of anus and rectum                           K62.89, Other specified diseases of anus and  rectum CPT copyright 2019 American Medical Association. All rights reserved. The codes documented in this report are preliminary and upon coder review may  be revised to meet current compliance requirements. Elon Alas. Abbey Chatters, DO [place] Abbey Chatters, DO 12/27/2021 11:12:27 AM This report has been signed electronically. Number of Addenda: 0

## 2021-12-27 NOTE — Transfer of Care (Signed)
Immediate Anesthesia Transfer of Care Note  Patient: George Gallagher  Procedure(s) Performed: COLONOSCOPY WITH PROPOFOL  Patient Location: Short Stay  Anesthesia Type:General  Level of Consciousness: drowsy  Airway & Oxygen Therapy: Patient Spontanous Breathing  Post-op Assessment: Report given to RN and Post -op Vital signs reviewed and stable  Post vital signs: Reviewed and stable  Last Vitals:  Vitals Value Taken Time  BP 121/71 12/27/21 1112  Temp 36.6 C 12/27/21 1112  Pulse 66 12/27/21 1112  Resp 21 12/27/21 1112  SpO2 94 % 12/27/21 1112    Last Pain:  Vitals:   12/27/21 1112  TempSrc: Axillary  PainSc: 0-No pain      Patients Stated Pain Goal: 8 (12/27/21 1035)  Complications: No notable events documented.

## 2021-12-27 NOTE — Anesthesia Preprocedure Evaluation (Signed)
Anesthesia Evaluation  Patient identified by MRN, date of birth, ID band Patient awake    Reviewed: Allergy & Precautions, H&P , NPO status , Patient's Chart, lab work & pertinent test results, reviewed documented beta blocker date and time   Airway Mallampati: II  TM Distance: >3 FB Neck ROM: full    Dental no notable dental hx.    Pulmonary neg pulmonary ROS, Current Smoker,    Pulmonary exam normal breath sounds clear to auscultation       Cardiovascular Exercise Tolerance: Good negative cardio ROS   Rhythm:regular Rate:Normal     Neuro/Psych  Neuromuscular disease negative psych ROS   GI/Hepatic negative GI ROS, Neg liver ROS,   Endo/Other  negative endocrine ROS  Renal/GU negative Renal ROS  negative genitourinary   Musculoskeletal   Abdominal   Peds  Hematology negative hematology ROS (+)   Anesthesia Other Findings   Reproductive/Obstetrics negative OB ROS                             Anesthesia Physical Anesthesia Plan  ASA: 2  Anesthesia Plan: General   Post-op Pain Management:    Induction:   PONV Risk Score and Plan: Propofol infusion  Airway Management Planned:   Additional Equipment:   Intra-op Plan:   Post-operative Plan:   Informed Consent: I have reviewed the patients History and Physical, chart, labs and discussed the procedure including the risks, benefits and alternatives for the proposed anesthesia with the patient or authorized representative who has indicated his/her understanding and acceptance.     Dental Advisory Given  Plan Discussed with: CRNA  Anesthesia Plan Comments:         Anesthesia Quick Evaluation  

## 2021-12-27 NOTE — Interval H&P Note (Signed)
History and Physical Interval Note:  12/27/2021 10:11 AM  George Gallagher  has presented today for surgery, with the diagnosis of rectal pain, rectal bleeding.  The various methods of treatment have been discussed with the patient and family. After consideration of risks, benefits and other options for treatment, the patient has consented to  Procedure(s) with comments: COLONOSCOPY WITH PROPOFOL (N/A) - 12:00pm, asa 3 as a surgical intervention.  The patient's history has been reviewed, patient examined, no change in status, stable for surgery.  I have reviewed the patient's chart and labs.  Questions were answered to the patient's satisfaction.     Lanelle Bal

## 2021-12-28 NOTE — Anesthesia Postprocedure Evaluation (Signed)
Anesthesia Post Note  Patient: George Gallagher  Procedure(s) Performed: COLONOSCOPY WITH PROPOFOL  Patient location during evaluation: Phase II Anesthesia Type: General Level of consciousness: awake Pain management: pain level controlled Vital Signs Assessment: post-procedure vital signs reviewed and stable Respiratory status: spontaneous breathing and respiratory function stable Cardiovascular status: blood pressure returned to baseline and stable Postop Assessment: no headache and no apparent nausea or vomiting Anesthetic complications: no Comments: Late entry   No notable events documented.   Last Vitals:  Vitals:   12/27/21 1035 12/27/21 1112  BP:  121/71  Pulse:  66  Resp: 15 (!) 21  Temp: (!) 36.4 C 36.6 C  SpO2: 99% 94%    Last Pain:  Vitals:   12/27/21 1112  TempSrc: Axillary  PainSc: 0-No pain                 Windell Norfolk

## 2022-01-05 ENCOUNTER — Encounter (HOSPITAL_COMMUNITY): Payer: Self-pay | Admitting: Internal Medicine

## 2022-01-06 ENCOUNTER — Encounter: Payer: Self-pay | Admitting: Gastroenterology

## 2022-01-06 ENCOUNTER — Ambulatory Visit (INDEPENDENT_AMBULATORY_CARE_PROVIDER_SITE_OTHER): Payer: BC Managed Care – PPO | Admitting: Gastroenterology

## 2022-01-06 VITALS — BP 117/79 | HR 81 | Temp 98.0°F | Ht 69.0 in | Wt 299.4 lb

## 2022-01-06 DIAGNOSIS — K648 Other hemorrhoids: Secondary | ICD-10-CM | POA: Diagnosis not present

## 2022-01-06 NOTE — Progress Notes (Signed)
      [facility] BANDING PROCEDURE NOTE  George Gallagher is a 46 y.o. male presenting today for consideration of hemorrhoid banding. Last colonoscopy Aug 2023: non-bleeding internal hemorrhoids, normal TI.    The patient presents with symptomatic grade 2 hemorrhoids, unresponsive to maximal medical therapy, requesting rubber band ligation of his hemorrhoidal disease. All risks, benefits, and alternative forms of therapy were described and informed consent was obtained. 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 He has requested two bandings today, which we will attempt if tolerates simply due to scheduling purposes.   The decision was made to band the right posterior and left lateral internal hemorrhoid, and the [facility] System was used to perform band ligation without complication. Digital anorectal examination was then performed to assure proper positioning of the band, and to adjust the banded tissue as required. The patient was discharged home without pain or other issues. Dietary and behavioral recommendations were given, along with follow-up instructions. The patient will return in several weeks for followup and possible additional banding as required.  No complications were encountered and the patient tolerated the procedure well.   Gelene Mink, PhD, ANP-BC [facility]

## 2022-01-06 NOTE — Patient Instructions (Signed)
  Please avoid straining.  You should limit your toilet time to 2-3 minutes at the most.   I recommend Benefiber 2 teaspoons each morning in the beverage of your choice!  Please call me with any concerns or issues!  I will see you in follow-up for additional banding in several weeks.  I enjoyed seeing you again today! As you know, I value our relationship and want to provide genuine, compassionate, and quality care. I welcome your feedback. If you receive a survey regarding your visit,  I greatly appreciate you taking time to fill this out. See you next time!  Melvena Vink W. Erhardt Dada, PhD, ANP-BC [facility]         

## 2022-01-11 ENCOUNTER — Emergency Department (HOSPITAL_COMMUNITY)
Admission: EM | Admit: 2022-01-11 | Discharge: 2022-01-11 | Disposition: A | Discharge status: Home / Self Care | Payer: BC Managed Care – PPO | Attending: Emergency Medicine | Admitting: Emergency Medicine

## 2022-01-11 ENCOUNTER — Emergency Department (HOSPITAL_COMMUNITY): Payer: BC Managed Care – PPO

## 2022-01-11 ENCOUNTER — Telehealth: Payer: Self-pay | Admitting: Gastroenterology

## 2022-01-11 ENCOUNTER — Encounter (HOSPITAL_COMMUNITY): Payer: Self-pay | Admitting: *Deleted

## 2022-01-11 ENCOUNTER — Other Ambulatory Visit: Payer: Self-pay

## 2022-01-11 DIAGNOSIS — K6289 Other specified diseases of anus and rectum: Secondary | ICD-10-CM | POA: Diagnosis present

## 2022-01-11 LAB — COMPREHENSIVE METABOLIC PANEL
ALT: 26 U/L (ref 0–44)
AST: 22 U/L (ref 15–41)
Albumin: 3.7 g/dL (ref 3.5–5.0)
Alkaline Phosphatase: 63 U/L (ref 38–126)
Anion gap: 5 (ref 5–15)
BUN: 16 mg/dL (ref 6–20)
CO2: 24 mmol/L (ref 22–32)
Calcium: 8.5 mg/dL — ABNORMAL LOW (ref 8.9–10.3)
Chloride: 109 mmol/L (ref 98–111)
Creatinine, Ser: 0.83 mg/dL (ref 0.61–1.24)
GFR, Estimated: >60 mL/min (ref >60)
Glucose, Bld: 102 mg/dL — ABNORMAL HIGH (ref 70–99)
Potassium: 3.9 mmol/L (ref 3.5–5.1)
Sodium: 138 mmol/L (ref 135–145)
Total Bilirubin: 0.4 mg/dL (ref 0.3–1.2)
Total Protein: 7.2 g/dL (ref 6.5–8.1)

## 2022-01-11 LAB — URINALYSIS, ROUTINE W REFLEX MICROSCOPIC
Bilirubin Urine: NEGATIVE
Glucose, UA: NEGATIVE mg/dL
Hgb urine dipstick: NEGATIVE
Ketones, ur: NEGATIVE mg/dL
Leukocytes,Ua: NEGATIVE
Nitrite: NEGATIVE
Protein, ur: NEGATIVE mg/dL
Specific Gravity, Urine: 1.016 (ref 1.005–1.030)
pH: 7 (ref 5.0–8.0)

## 2022-01-11 LAB — CBC WITH DIFFERENTIAL/PLATELET
Abs Immature Granulocytes: 0.02 10*3/uL (ref 0.00–0.07)
Basophils Absolute: 0 10*3/uL (ref 0.0–0.1)
Basophils Relative: 0 %
Eosinophils Absolute: 0.3 10*3/uL (ref 0.0–0.5)
Eosinophils Relative: 4 %
HCT: 42.7 % (ref 39.0–52.0)
Hemoglobin: 13.9 g/dL (ref 13.0–17.0)
Immature Granulocytes: 0 %
Lymphocytes Relative: 21 %
Lymphs Abs: 1.5 10*3/uL (ref 0.7–4.0)
MCH: 29.2 pg (ref 26.0–34.0)
MCHC: 32.6 g/dL (ref 30.0–36.0)
MCV: 89.7 fL (ref 80.0–100.0)
Monocytes Absolute: 0.7 10*3/uL (ref 0.1–1.0)
Monocytes Relative: 10 %
Neutro Abs: 4.6 10*3/uL (ref 1.7–7.7)
Neutrophils Relative %: 65 %
Platelets: 297 10*3/uL (ref 150–400)
RBC: 4.76 MIL/uL (ref 4.22–5.81)
RDW: 12.5 % (ref 11.5–15.5)
WBC: 7.1 10*3/uL (ref 4.0–10.5)
nRBC: 0 % (ref 0.0–0.2)

## 2022-01-11 MED ORDER — GADOBUTROL 1 MMOL/ML IV SOLN
10.0000 mL | Freq: Once | INTRAVENOUS | Status: AC | PRN
  Administered 2022-01-11: 10 mL via INTRAVENOUS

## 2022-01-11 MED ORDER — HYDROMORPHONE HCL 1 MG/ML IJ SOLN
1.0000 mg | Freq: Once | INTRAMUSCULAR | Status: AC
  Administered 2022-01-11: 1 mg via INTRAVENOUS
  Filled 2022-01-11: qty 1

## 2022-01-11 MED ORDER — IOHEXOL 300 MG/ML  SOLN
100.0000 mL | Freq: Once | INTRAMUSCULAR | Status: AC | PRN
  Administered 2022-01-11: 100 mL via INTRAVENOUS

## 2022-01-11 MED ORDER — ONDANSETRON HCL 4 MG/2ML IJ SOLN
4.0000 mg | Freq: Once | INTRAMUSCULAR | Status: AC
  Administered 2022-01-11: 4 mg via INTRAVENOUS
  Filled 2022-01-11: qty 2

## 2022-01-11 MED ORDER — FENTANYL CITRATE PF 50 MCG/ML IJ SOSY
50.0000 ug | PREFILLED_SYRINGE | Freq: Once | INTRAMUSCULAR | Status: AC
  Administered 2022-01-11: 50 ug via INTRAVENOUS
  Filled 2022-01-11: qty 1

## 2022-01-11 MED ORDER — HYDROMORPHONE HCL 1 MG/ML IJ SOLN
0.5000 mg | Freq: Once | INTRAMUSCULAR | Status: AC
  Administered 2022-01-11: 0.5 mg via INTRAVENOUS
  Filled 2022-01-11: qty 0.5

## 2022-01-11 MED ORDER — SODIUM CHLORIDE 0.9 % IV BOLUS
500.0000 mL | Freq: Once | INTRAVENOUS | Status: AC
  Administered 2022-01-11: 500 mL via INTRAVENOUS

## 2022-01-11 NOTE — ED Notes (Signed)
Patient to MRI at this time.

## 2022-01-11 NOTE — ED Provider Notes (Signed)
[facility] EMERGENCY DEPARTMENT Provider Note   CSN: [id number] Arrival date & time: 01/11/22  7121     History  Chief Complaint  Patient presents with   Rectal Pain    George Gallagher is a 46 y.o. male.  HPI      George Gallagher is a 46 y.o. male who presents to the Emergency Department complaining of rectal pain and bleeding x4 days.  He had hemorrhoid banding procedure performed 5 days ago without complication.  The following day, he noted having pain to his left rectal area that radiated into his buttock and left scrotum.  Denies swelling.  He describes having some blood with defecation.  He also notes having some feeling of a "blockage" in his rectum.  He is defecating, notes having hard stool at onset followed by soft loose stool.  He denies any abdominal pain, fever or chills, nausea or vomiting.  Passing flatus.    Home Medications Prior to Admission medications   Medication Sig Start Date End Date Taking? Authorizing Provider  atorvastatin (LIPITOR) 10 MG tablet Take 10 mg by mouth daily. 11/19/21   [provider]  diltiazem 2 % GEL Apply 1 Application topically daily as needed (wound healing).    [provider]  naproxen (NAPROSYN) 500 MG tablet Take 1 tablet (500 mg total) by mouth 2 (two) times daily with a meal. 01/17/17   Darreld Mclean, MD      Allergies    Patient has no known allergies.    Review of Systems   Review of Systems  Constitutional:  Negative for appetite change, chills and fever.  Respiratory:  Negative for shortness of breath.   Cardiovascular:  Negative for chest pain.  Gastrointestinal:  Positive for anal bleeding and rectal pain. Negative for abdominal distention, abdominal pain, nausea and vomiting.  Genitourinary:  Negative for decreased urine volume, difficulty urinating and dysuria.  Musculoskeletal:  Negative for back pain.  Skin:  Negative for color change and rash.  Neurological:  Negative for weakness and  numbness.    Physical Exam Updated Vital Signs BP 110/75   Pulse 63   Temp 98.5 F (36.9 C) (Oral)   Resp 17   Ht 5\' 9"  (1.753 m)   Wt 135 kg   SpO2 97%   BMI 43.95 kg/m  Physical Exam Vitals and nursing note reviewed.  Constitutional:      General: He is not in acute distress.    Appearance: Normal appearance. He is not toxic-appearing.  HENT:     Mouth/Throat:     Mouth: Mucous membranes are moist.  Cardiovascular:     Rate and Rhythm: Normal rate and regular rhythm.     Pulses: Normal pulses.  Pulmonary:     Effort: Pulmonary effort is normal. No respiratory distress.     Breath sounds: No wheezing.  Abdominal:     General: There is no distension.     Palpations: Abdomen is soft.     Tenderness: There is no abdominal tenderness. There is no guarding.  Genitourinary:    Rectum: Tenderness present. No anal fissure or external hemorrhoid. Normal anal tone.     Comments: Limited DRE due to level of pain and swelling noted in the rectal vault.  I am unable to pass more than one inch of my finger into the rectum as there is significant swelling noted.  No active bleeding, obvious external anal fissure or external hemorrhoid noted. Musculoskeletal:  General: Normal range of motion.  Skin:    General: Skin is warm.     Capillary Refill: Capillary refill takes less than 2 seconds.     Findings: No rash.  Neurological:     General: No focal deficit present.     Mental Status: He is alert.     Sensory: No sensory deficit.     Motor: No weakness.     ED Results / Procedures / Treatments   Labs (all labs ordered are listed, but only abnormal results are displayed) Labs Reviewed  COMPREHENSIVE METABOLIC PANEL - Abnormal; Notable for the following components:      Result Value   Glucose, Bld 102 (*)    Calcium 8.5 (*)    All other components within normal limits  URINALYSIS, ROUTINE W REFLEX MICROSCOPIC - Abnormal; Notable for the following components:   Color,  Urine STRAW (*)    All other components within normal limits  CBC WITH DIFFERENTIAL/PLATELET    EKG None  Radiology MR PELVIS W WO CONTRAST  Result Date: 01/11/2022 CLINICAL DATA:  Rectal pain and bleeding.  Post hemorrhoid banding. EXAM: MRI PELVIS WITHOUT AND WITH CONTRAST TECHNIQUE: Multiplanar multisequence MR imaging of the pelvis was performed both before and after administration of intravenous contrast. CONTRAST:  12mL GADAVIST GADOBUTROL 1 MMOL/ML IV SOLN COMPARISON:  Previous CT of January 11, 2021. FINDINGS: Urinary Tract: No distal ureteral dilation or signs of inflammation. Bowel: Mild edema in the sphincter complex. Area of edema in the internal sphincter at the 1 o'clock position along the LEFT anterolateral aspect and some edema extending into the intersphincteric plane. No focal fluid collection. No inflammation above the pelvic floor. Changes above are quite subtle. Query mild increased enhancement along the posterior margin of the sphincter complex on post-contrast images in the area of concern on previous CT. Remaining visualized bowel is unremarkable. Limited assessment of gastrointestinal structures, assessment focused on the sphincter complex and distal rectum. Vascular/Lymphatic: No adenopathy in the pelvis vascular structures are not well assessed due to protocol focused on the pelvic floor. Reproductive: Small scrotal hydroceles. Scrotum incompletely image. Prostate grossly unremarkable. Other:  None. Musculoskeletal: No suspicious bone lesions identified. IMPRESSION: 1. Mild edema and enhancement in the sphincter complex as discussed. This is likely post procedural. No evidence of abscess formation. 2. Small scrotal hydroceles. Electronically Signed   By: Donzetta Kohut M.D.   On: 01/11/2022 14:18   CT PELVIS W CONTRAST  Result Date: 01/11/2022 CLINICAL DATA:  Rectal pain and bleeding. Hemorrhoid banding 5 days ago. EXAM: CT PELVIS WITH CONTRAST TECHNIQUE: Multidetector CT  imaging of the pelvis was performed using the standard protocol following the bolus administration of intravenous contrast. RADIATION DOSE REDUCTION: This exam was performed according to the departmental dose-optimization program which includes automated exposure control, adjustment of the mA and/or kV according to patient size and/or use of iterative reconstruction technique. CONTRAST:  OMNIPAQUE IOHEXOL 300 MG/ML  SOLN COMPARISON:  11/04/2006 FINDINGS: Urinary Tract:  No abnormality visualized. Bowel: Visualized small bowel loops and colonic segments are unremarkable. Small 12 mm focus of soft tissue attenuation is seen immediately posterior to the anus (6 o'clock position) this finding is indeterminate but could be a small focus of inflammation. There is no drainable perirectal or perianal abscess. The ischial anal fat is well preserved bilaterally. Vascular/Lymphatic: No pathologically enlarged lymph nodes. No significant vascular abnormality seen. Reproductive:  No mass or other significant abnormality Other:  None. Musculoskeletal: No worrisome lytic or sclerotic  osseous abnormality. IMPRESSION: 12 mm focus of soft tissue attenuation immediately posterior to the anus is indeterminate but could be a small focus of inflammation. There is no drainable perirectal or perianal abscess. No definite perianal fistula. Follow-up pelvic MRI with and without contrast may prove helpful to further evaluate as clinically warranted. Electronically Signed   By: Kennith Center M.D.   On: 01/11/2022 10:16     Procedures Procedures    Medications Ordered in ED Medications  fentaNYL (SUBLIMAZE) injection 50 mcg (50 mcg Intravenous Given 01/11/22 0930)  ondansetron (ZOFRAN) injection 4 mg (4 mg Intravenous Given 01/11/22 0929)  sodium chloride 0.9 % bolus 500 mL (500 mLs Intravenous New Bag/Given 01/11/22 0017)    ED Course/ Medical Decision Making/ A&P                           Medical Decision Making Patient  here with rectal pain and bleeding 5 days status post hemorrhoid banding.  Is having pain to the left side of his rectum buttocks and scrotum.  He does not have any abdominal pain fever or chills nausea or vomiting.  On exam, patient is uncomfortable appearing, nontoxic.  Vital signs are reassuring.  His abdomen is soft without peritoneal signs.  He does have some significant tenderness with attempted DRE, there is swelling noted within the rectal vault and I am unable to pass more than 1 inch of my finger into the rectum.  No frank blood.  Differential diagnosis would include but not limited to rectal abscess, incomplete banding, constipation  Amount and/or Complexity of Data Reviewed Labs: ordered.    Details: Labs unremarkable Radiology: ordered.    Details: CT pelvis shows 12 mm focus of soft tissue attenuation that is indeterminate but could be a small focus of inflammation MRI recommended to further evaluate  MRI pelvis shows mild edema and enhancement in the sphincter complex likely post procedural.  No abscess formation. Discussion of management or test interpretation with external provider(s): Patient here with left-sided rectal pain that radiates to buttocks and groin area, 5 days status post hemorrhoid banding.  Reassuring work-up today, I will consult with his GI provider  Discussed findings with GI, Lewie Loron, NP, who reviewed images and discussed them with radiologist.  She recommends that patient continue sitz bath's, stool softeners and use of compounded rectal cream given postprocedure.  She will follow him closely in clinic.  Patient agreeable to plan.  Appears appropriate for discharge home.  Risk Prescription drug management.           Final Clinical Impression(s) / ED Diagnoses Final diagnoses:  Rectal pain    Rx / DC Orders ED Discharge Orders     None         Pauline Aus, PA-C 01/13/22 1608    Loetta Rough, MD 01/16/22 1217

## 2022-01-11 NOTE — Telephone Encounter (Signed)
George Gallagher: needs office visit next week with me. Can use urgent. I believe I have openings on Tuesday.  Tammy/Courtney: can we follow-up with him Wednesday or Thursday and see how he is feeling?

## 2022-01-11 NOTE — ED Notes (Signed)
Beeped to [phone].George Gallagher

## 2022-01-11 NOTE — Discharge Instructions (Signed)
As discussed, continue to use your rectal cream 4 times a day.  Continue sitz bath's, Metamucil and you may also use MiraLAX as directed to help keep your stools soft and loose.  Avoid sitting on the toilet for excessive period of time and avoid straining to have a bowel movement as this can aggravate your symptoms.  Someone from [employer] office will call you to arrange a follow-up appointment.  Return to the emergency department if you develop any worsening symptoms

## 2022-01-11 NOTE — ED Triage Notes (Signed)
Pt states he had hemorrhoid surgery x 5 days ago and today has been having severe pain and bright red blood with bowel movements

## 2022-01-12 ENCOUNTER — Encounter: Payer: Self-pay | Admitting: Gastroenterology

## 2022-01-12 ENCOUNTER — Ambulatory Visit (INDEPENDENT_AMBULATORY_CARE_PROVIDER_SITE_OTHER): Payer: BC Managed Care – PPO | Admitting: Gastroenterology

## 2022-01-12 VITALS — BP 115/77 | HR 80 | Temp 98.6°F | Ht 69.0 in | Wt 300.4 lb

## 2022-01-12 DIAGNOSIS — K602 Anal fissure, unspecified: Secondary | ICD-10-CM

## 2022-01-12 NOTE — Progress Notes (Signed)
Gastroenterology Office Note     Primary Care Physician:  Lovey Newcomer, [place]  Primary Gastroenterologist: Dr. Marletta Lor    Chief Complaint   Chief Complaint  Patient presents with   Hemorrhoids    Follow up on hemorrhoid. Had banding on 9/7. Went to ED yesterday.      History of Present Illness   George Gallagher is a 46 y.o. male presenting today in follow-up with a history of symptomatic Grade 2 hemorrhoids, s/p banding X 2 on 9/7. He presented to the ED on 9/12 with significant rectal pain. Imaging including CT pelvis and MRI pelvis with mild edema and enhancement in sphincter complex.   Returns today in close follow-up. Prune juice, benefiber. BM this morning. Soft. Burning in rectum and throbbing after BM. No stool softeners currently but did do over the weekend.   Dealing with this pain since May, which flared up after banding.     Past Medical History:  Diagnosis Date   Chronic back pain     Past Surgical History:  Procedure Laterality Date   COLONOSCOPY WITH PROPOFOL N/A 12/27/2021   Procedure: COLONOSCOPY WITH PROPOFOL;  Surgeon: Lanelle Bal, DO;  [facility];  Service: Endoscopy;  Laterality: N/A;  12:00pm, asa 3   HEMORROIDECTOMY     KNEE SURGERY     SHOULDER SURGERY     TONSILLECTOMY     VASECTOMY      Current Outpatient Medications  Medication Sig Dispense Refill   atorvastatin (LIPITOR) 10 MG tablet Take 10 mg by mouth daily.     diltiazem 2 % GEL Apply 1 Application topically daily as needed (wound healing).     naproxen sodium (ALEVE) 220 MG tablet Take 220 mg by mouth.     No current facility-administered medications for this visit.    Allergies as of 01/12/2022   (No Known Allergies)    Family History  Problem Relation Age of Onset   Diabetes Maternal Grandfather    Colon cancer Neg Hx    Colon polyps Neg Hx     Social History   Socioeconomic History   Marital status: Married    Spouse name: Not on file    Number of children: Not on file   Years of education: Not on file   Highest education level: Not on file  Occupational History   Not on file  Tobacco Use   Smoking status: Some Days   Smokeless tobacco: Never  Vaping Use   Vaping Use: Never used  Substance and Sexual Activity   Alcohol use: Yes    Comment: occ   Drug use: No   Sexual activity: Not on file  Other Topics Concern   Not on file  Social History Narrative   Not on file   Social Determinants of Health   Financial Resource Strain: Not on file  Food Insecurity: Not on file  Transportation Needs: Not on file  Physical Activity: Not on file  Stress: Not on file  Social Connections: Not on file  Intimate Partner Violence: Not on file     Review of Systems   Gen: Denies any fever, chills, fatigue, weight loss, lack of appetite.  CV: Denies chest pain, heart palpitations, peripheral edema, syncope.  Resp: Denies shortness of breath at rest or with exertion. Denies wheezing or cough.  GI:see HPI GU : Denies urinary burning, urinary frequency, urinary hesitancy MS: Denies joint pain, muscle weakness, cramps, or limitation of movement.  Derm:  Denies rash, itching, dry skin Psych: Denies depression, anxiety, memory loss, and confusion Heme: Denies bruising, bleeding, and enlarged lymph nodes.   Physical Exam   BP 115/77 (BP Location: Left Arm, Patient Position: Sitting, Cuff Size: Large)   Pulse 80   Temp 98.6 F (37 C) (Oral)   Ht 5\' 9"  (1.753 m)   Wt (!) 300 lb 6.4 oz (136.3 kg)   BMI 44.36 kg/m  General:   Alert and oriented. Pleasant and cooperative. Well-nourished and well-developed.  Head:  Normocephalic and atraumatic. Eyes:  Without icterus Abdomen:  +BS, soft, non-tender and non-distended. No HSM noted. No guarding or rebound. No masses appreciated.  Rectal:  no obvious fissure. Unable to complete full DRE due to pain with insertion.  Msk:  Symmetrical without gross deformities. Normal  posture. Extremities:  Without edema. Neurologic:  Alert and  oriented x4;  grossly normal neurologically. Skin:  Intact without significant lesions or rashes. Psych:  Alert and cooperative. Normal mood and affect.   Assessment   George Gallagher is a 46 y.o. male presenting today in follow-up with a history of  symptomatic Grade 2 hemorrhoids, s/p banding X 2 on 9/7. He presented to the ED on 9/12 with significant rectal pain. Imaging including CT pelvis and MRI pelvis with mild edema and enhancement in sphincter complex.   Suspect flare of anal fissure. Unable to do complete exam today due to pain. Recommending 100 mg colace BID, Miralax once to BID to keep stools soft. Continue with 11/12 Apothecary hemorrhoid cream. Will go ahead and refer to Surgery as he has had intermittent pain since May, which flared after attempting banding.      PLAN    Continue compounded rectal cream 4 times a day 100 mg Colace BID, Miralax once to BID Surgery referral Return in October 2023   November 2023, PhD, [facility]astroenter[facility]

## 2022-01-12 NOTE — Patient Instructions (Signed)
I am referring you to a surgeon just to be evaluated in case a procedure is needed in the future.  Continue the cream 4 times a day. You can use lidocaine ointment over the counter as well as needed.  Please message me if no improvement.  I recommend 2 stool softeners (1 in am, 1 in pm), and 1 capful of Miralax in full glass of water once to twice a day. We wants very soft bowel movements.  I enjoyed seeing you again today! As you know, I value our relationship and want to provide genuine, compassionate, and quality care. I welcome your feedback. If you receive a survey regarding your visit,  I greatly appreciate you taking time to fill this out. See you next time!  Gelene Mink, PhD, ANP-BC [facility]

## 2022-01-12 NOTE — Telephone Encounter (Signed)
Spoke to pt, he informed me that he is about the same. States he is still has that dull pain in his rectum area. He states he has taking a Sitz bath and is using the cream. Still taking the MiraLax also.

## 2022-01-13 ENCOUNTER — Telehealth: Payer: Self-pay

## 2022-01-13 NOTE — Telephone Encounter (Signed)
Noted  

## 2022-01-13 NOTE — Telephone Encounter (Signed)
Called and verified with Apolinar Junes, pharmacist, that the refill should be for the compounded hemorrhoid cream with diltiazem.

## 2022-01-13 NOTE — Telephone Encounter (Signed)
Apolinar Junes (the pharmacist) from [place] [phone]) phoned regarding a Rx that you phoned in for the pt. I do not see where you sent in anything on his med list. Please advise

## 2022-01-13 NOTE — Telephone Encounter (Signed)
Thanks, Toni Amend! I saw him yesterday actually. It will take some time to heal. Thanks for update

## 2022-01-18 ENCOUNTER — Telehealth: Payer: Self-pay

## 2022-01-18 NOTE — Telephone Encounter (Signed)
Correct, he does not need to come on Thursday. I do need to see him in about 4 weeks, though. He likely has an anal fissure. Yes, he can have intermediate FMLA.

## 2022-01-18 NOTE — Telephone Encounter (Signed)
Noted   Phoned and advised the pt to come pick up his FMLA paperwork and that his appt for Thursday can be cancelled but Vicente Males did want to see him in 4 weeks. Pt was transferred to the front to be scheduled for 4 weeks.

## 2022-01-18 NOTE — Telephone Encounter (Signed)
Phoned the pt and LMOVM regarding FMLA paperwork left on my desk. I see in the chart where the pt is to see Dr Aviva Signs on 02/03/2022. (Dx: anal fissure).

## 2022-01-18 NOTE — Telephone Encounter (Signed)
Pt returned call and advised me that he had spoke with you regarding FMLA and that you could do it for intermediate.   Pt is also wanting to know about appt this Thursday. He doesn't think he needs it because you told him no more bandings or internal checking. Pt did say he is still having bleeding and pain. I advised the pt that he needs to be evaluated if it was worse than before we did the banding. Pt doesn't want to but still wanted you to know. Please advise. I have the FMLA paperwork on my desk.

## 2022-01-20 ENCOUNTER — Encounter: Payer: BC Managed Care – PPO | Admitting: Gastroenterology

## 2022-02-03 ENCOUNTER — Encounter: Payer: Self-pay | Admitting: *Deleted

## 2022-02-03 ENCOUNTER — Ambulatory Visit (INDEPENDENT_AMBULATORY_CARE_PROVIDER_SITE_OTHER): Payer: BC Managed Care – PPO | Admitting: General Surgery

## 2022-02-03 ENCOUNTER — Encounter: Payer: Self-pay | Admitting: General Surgery

## 2022-02-03 ENCOUNTER — Other Ambulatory Visit: Payer: Self-pay

## 2022-02-03 VITALS — BP 114/74 | HR 79 | Temp 98.0°F | Resp 18 | Ht 69.0 in | Wt 301.0 lb

## 2022-02-03 DIAGNOSIS — K6289 Other specified diseases of anus and rectum: Secondary | ICD-10-CM | POA: Diagnosis not present

## 2022-02-03 NOTE — Progress Notes (Signed)
George Gallagher; [id number]; 09-14-1975   HPI Patient is a 46 year old black male who was referred to my care by Roseanne Kaufman for evaluation and treatment of rectal pain.  Patient has been having intermittent rectal bleeding and pain for approximately 5 months.  He underwent colonoscopy in August 2023 by Dr. Abbey Chatters.  He has subsequently underwent banding of internal hemorrhoids on 01/06/2022.  Since that time, he has had significant rectal pain, especially on the left side.  He has had intermittent blood per rectum.  He has had ongoing pain despite rectal creams which he has been taking for several months.  He was referred to my care for a possible anal fissure.  He states the pain comes and goes and lately occurs every 3 to 4 days.  He does have to strain to move his bowels due to pain and anxiety.  He has been using nitroglycerin cream as well as a pharmacy compounded rectal cream. Past Medical History:  Diagnosis Date   Chronic back pain     Past Surgical History:  Procedure Laterality Date   COLONOSCOPY WITH PROPOFOL N/A 12/27/2021   Procedure: COLONOSCOPY WITH PROPOFOL;  Surgeon: Eloise Harman, DO;  [facility];  Service: Endoscopy;  Laterality: N/A;  12:00pm, asa 3   EXCISION CYST, NECK     HEMORROIDECTOMY     KNEE SURGERY     SHOULDER SURGERY     TONSILLECTOMY     VASECTOMY      Family History  Problem Relation Age of Onset   Diabetes Maternal Grandfather    Diabetes Paternal Grandfather    Colon cancer Neg Hx    Colon polyps Neg Hx     Current Outpatient Medications on File Prior to Visit  Medication Sig Dispense Refill   atorvastatin (LIPITOR) 10 MG tablet Take 10 mg by mouth daily.     diltiazem 2 % GEL Apply 1 Application topically daily as needed (wound healing).     naproxen sodium (ALEVE) 220 MG tablet Take 220 mg by mouth.     No current facility-administered medications on file prior to visit.    No Known Allergies  Social History   Substance and  Sexual Activity  Alcohol Use Yes   Comment: occ    Social History   Tobacco Use  Smoking Status Some Days   Passive exposure: Never  Smokeless Tobacco Never    Review of Systems  Constitutional: Negative.   HENT: Negative.    Eyes: Negative.   Respiratory: Negative.    Cardiovascular: Negative.   Gastrointestinal: Negative.   Genitourinary: Negative.   Musculoskeletal: Negative.   Skin: Negative.   Neurological: Negative.   Endo/Heme/Allergies: Negative.   Psychiatric/Behavioral: Negative.      Objective   Vitals:   02/03/22 1048  BP: 114/74  Pulse: 79  Resp: 18  Temp: 98 F (36.7 C)  SpO2: 94%    Physical Exam Vitals reviewed.  Constitutional:      Appearance: Normal appearance. He is obese. He is not ill-appearing.  HENT:     Head: Normocephalic and atraumatic.  Cardiovascular:     Rate and Rhythm: Normal rate and regular rhythm.     Heart sounds: Normal heart sounds. No murmur heard.    No friction rub. No gallop.  Pulmonary:     Effort: Pulmonary effort is normal. No respiratory distress.     Breath sounds: Normal breath sounds. No stridor. No wheezing, rhonchi or rales.  Genitourinary:  Comments: No external hemorrhoids noted.  Examination was limited secondary to pain, but he did have some pain both posteriorly and along the left lower aspect of the anus.  No blood was present.  It was hard to feel a discrete anal fissure. Skin:    General: Skin is warm and dry.  Neurological:     Mental Status: He is alert and oriented to person, place, and time.     Assessment  Rectal pain of unknown etiology.  This could be related to his recent banding episode.  An occult anal fissure could also be present.  At this point, I want to wait and see how he recovers from the banding prior to doing any examination under anesthesia. Plan  We will see patient back in 1 month for follow-up.  He has been given Rectiv cream samples.  He was told to start Colace stool  softeners.  He should avoid straining when he moves his bowels.

## 2022-02-17 ENCOUNTER — Ambulatory Visit (INDEPENDENT_AMBULATORY_CARE_PROVIDER_SITE_OTHER): Payer: BC Managed Care – PPO | Admitting: Gastroenterology

## 2022-02-17 ENCOUNTER — Encounter: Payer: Self-pay | Admitting: Gastroenterology

## 2022-02-17 VITALS — BP 120/78 | HR 68 | Temp 98.2°F | Ht 69.0 in | Wt 306.6 lb

## 2022-02-17 DIAGNOSIS — K6289 Other specified diseases of anus and rectum: Secondary | ICD-10-CM | POA: Diagnosis not present

## 2022-02-17 NOTE — Progress Notes (Signed)
Gastroenterology Office Note     Primary Care Physician:  Lavella Lemons, [place]  Primary Gastroenterologist: Dr. Abbey Chatters    Chief Complaint   Chief Complaint  Patient presents with   Follow-up    Hemorrhoids and fissure     History of Present Illness   George Gallagher is a 46 y.o. male presenting today in follow-up with a history of symptomatic Grade 2 hemorrhoids, s/p banding X 2 on 9/7. He presented to the ED on 9/12 with significant rectal pain. Imaging including CT pelvis and MRI pelvis with mild edema and enhancement in sphincter complex.   Has been dealing with this pain since May.   Spasms have improved. Will have pain with BM and will take the whole day for it to improve. [place] [facility] cream without improvement (with diltiazem), went thru 2 tubes. Prior to this, he had used nifidepine without improvement. Nitro had caused headaches. Using nitrogylcerin but only weekends. During week using a preparation H suppository, which feels like he gets more relief from that. Rectal care OTC cream.   Nitro gives headaches but not using gloves. Nifidepine not helpful.    BM once to twice a day. Sometimes straining. Taking colace 3 a day. Not taking Miralax. States gets spasms with this. Pain has slightly improved but still affecting quality of life. Pain onset with BMs and takes whole day to recover.     Past Medical History:  Diagnosis Date   Chronic back pain     Past Surgical History:  Procedure Laterality Date   COLONOSCOPY WITH PROPOFOL N/A 12/27/2021   Procedure: COLONOSCOPY WITH PROPOFOL;  Surgeon: Eloise Harman, DO;  [facility];  Service: Endoscopy;  Laterality: N/A;  12:00pm, asa 3   EXCISION CYST, NECK     HEMORROIDECTOMY     KNEE SURGERY     SHOULDER SURGERY     TONSILLECTOMY     VASECTOMY      Current Outpatient Medications  Medication Sig Dispense Refill   atorvastatin (LIPITOR) 10 MG tablet Take 10 mg by mouth daily.      naproxen sodium (ALEVE) 220 MG tablet Take 220 mg by mouth. (Patient not taking: Reported on 02/17/2022)     No current facility-administered medications for this visit.    Allergies as of 02/17/2022   (No Known Allergies)    Family History  Problem Relation Age of Onset   Diabetes Maternal Grandfather    Diabetes Paternal Grandfather    Colon cancer Neg Hx    Colon polyps Neg Hx     Social History   Socioeconomic History   Marital status: Married    Spouse name: Not on file   Number of children: Not on file   Years of education: Not on file   Highest education level: Not on file  Occupational History   Not on file  Tobacco Use   Smoking status: Some Days    Passive exposure: Never   Smokeless tobacco: Never  Vaping Use   Vaping Use: Never used  Substance and Sexual Activity   Alcohol use: Yes    Comment: occ   Drug use: No   Sexual activity: Yes  Other Topics Concern   Not on file  Social History Narrative   Not on file   Social Determinants of Health   Financial Resource Strain: Not on file  Food Insecurity: Not on file  Transportation Needs: Not on file  Physical Activity: Not on  file  Stress: Not on file  Social Connections: Not on file  Intimate Partner Violence: Not on file     Review of Systems   Gen: Denies any fever, chills, fatigue, weight loss, lack of appetite.  CV: Denies chest pain, heart palpitations, peripheral edema, syncope.  Resp: Denies shortness of breath at rest or with exertion. Denies wheezing or cough.  GI: Denies dysphagia or odynophagia. Denies jaundice, hematemesis, fecal incontinence. GU : Denies urinary burning, urinary frequency, urinary hesitancy MS: Denies joint pain, muscle weakness, cramps, or limitation of movement.  Derm: Denies rash, itching, dry skin Psych: Denies depression, anxiety, memory loss, and confusion Heme: Denies bruising, bleeding, and enlarged lymph nodes.   Physical Exam   BP 120/78   Pulse 68    Temp 98.2 F (36.8 C)   Ht 5\' 9"  (1.753 m)   Wt (!) 306 lb 9.6 oz (139.1 kg)   BMI 45.28 kg/m  General:   Alert and oriented. Pleasant and cooperative. Well-nourished and well-developed.  Head:  Normocephalic and atraumatic. Eyes:  Without icterus Rectal:  no external hemorrhoids. I was able to insert finger slightly farther than last DRE a few weeks ago but still with notable pain left lateral side.  Msk:  Symmetrical without gross deformities. Normal posture. Extremities:  Without edema. Neurologic:  Alert and  oriented x4;  grossly normal neurologically. Skin:  Intact without significant lesions or rashes. Psych:  Alert and cooperative. Normal mood and affect.   Assessment   George Gallagher is a 46 y.o. male presenting today in follow-up with a history of symptomatic Grade 2 hemorrhoids, s/p banding X 2 on 9/7. He presented to the ED on 9/12 with significant rectal pain. Imaging including CT pelvis and MRI pelvis with mild edema and enhancement in sphincter complex.   Suspected to have anal fissure; he initially had this pain starting in May but flared after banding. Banding likely exacerbated the fissure. He has tried diltiazem without improvement, nifedipine without improvement, and currently using Nitro. However, he is only using this on weekends due to headaches. I note he is not using gloves. We discussed wearing gloves then applying to see if this helps limit any extra exposure.   Appreciate surgical consultation. Internal hemorrhoids are not prolapsing and would not be causing pain as he is having. He is well-healed from prior banding. Keep upcoming appt with surgery.   Will trial Linzess 72 mcg and possibly 145 mcg if needed as still has intermittent straining. Did not tolerate Miralax well stating this caused rectal spasms.      PLAN    Continue stool softeners Continue Nitro with goal of 3-4 times a day; use gloves Lidocaine ointment prn Linzess 72 mcg trial. If  needed, may try Linzess 145 mcg. Samples provided Keep upcoming surgery appt Call if no improvement    Annitta Needs, PhD, [facility]astroenter[facility]

## 2022-02-17 NOTE — Patient Instructions (Signed)
You can use over the counter lidocaine ointment as needed.  I recommend trying the nitro with gloves, wash hands after as you are doing. Let me know if you still have headaches with this. It is best to take it at least twice a day consistently. If you can tolerate 3-4 times a day, that's even better.  Let's try Linzess 72 micrograms once a day. Take on an empty stomach at least 30 minutes before eating. Sometimes this can cause looser stool starting out, but it gets better in a few days. If the lower dosage doesn't work, you can try the Linzess 145 micrograms.  Please keep me updated with the surgeon appointment!  I enjoyed seeing you again today! As you know, I value our relationship and want to provide genuine, compassionate, and quality care. I welcome your feedback. If you receive a survey regarding your visit,  I greatly appreciate you taking time to fill this out. See you next time!  Annitta Needs, PhD, ANP-BC [facility]astroenter[facility]

## 2022-02-23 ENCOUNTER — Telehealth: Payer: Self-pay

## 2022-02-23 MED ORDER — LINACLOTIDE 72 MCG PO CAPS: 72.0000 ug | ORAL_CAPSULE | Freq: Every day | ORAL | 3 refills | Status: DC

## 2022-02-23 NOTE — Telephone Encounter (Signed)
Pt's insurance states pt has to try Trulance first. We have samples of Trulance and we have plenty of samples of Linzess 72 mcg. We can give pt samples to start things off. Please advise

## 2022-02-24 NOTE — Telephone Encounter (Signed)
Great, let's provide samples of Trulance to try. Let us know how it works. If it works well, we will stick with Trulance. If not, we will submit for Linzess again.

## 2022-02-24 NOTE — Telephone Encounter (Signed)
Phoned and spoke with the pt and advised him of the Trulance. Pt came by because he was right up the street. Give samples and instructions.

## 2022-02-24 NOTE — Telephone Encounter (Signed)
Noted  

## 2022-03-03 ENCOUNTER — Encounter: Payer: Self-pay | Admitting: *Deleted

## 2022-03-03 ENCOUNTER — Ambulatory Visit (INDEPENDENT_AMBULATORY_CARE_PROVIDER_SITE_OTHER): Payer: BC Managed Care – PPO | Admitting: General Surgery

## 2022-03-03 ENCOUNTER — Encounter: Payer: Self-pay | Admitting: General Surgery

## 2022-03-03 VITALS — BP 115/75 | HR 74 | Temp 97.6°F | Resp 14 | Ht 69.0 in | Wt 308.0 lb

## 2022-03-03 DIAGNOSIS — K602 Anal fissure, unspecified: Secondary | ICD-10-CM

## 2022-03-03 MED ORDER — TRULANCE 3 MG PO TABS: 1.0000 | ORAL_TABLET | Freq: Every day | ORAL | 3 refills | Status: AC

## 2022-03-03 NOTE — Addendum Note (Signed)
Addended by: Annitta Needs on: 03/03/2022 12:38 PM   Modules accepted: Orders

## 2022-03-03 NOTE — Progress Notes (Signed)
Subjective:     George Gallagher  Patient here for follow-up of treatment for anal fissure.  He states he is doing significantly better.  He is seeing Roseanne Kaufman of gastroenterology for control of his bowel movements.  He has started a stool softener and is taking another medication for his irritable bowel.  He is not having blood in his stools.  His rectal pain has almost resolved.  I did review Vicente Males Boone's note. Objective:    BP 115/75   Pulse 74   Temp 97.6 F (36.4 C) (Oral)   Resp 14   Ht 5\' 9"  (1.753 m)   Wt (!) 308 lb (139.7 kg)   SpO2 94%   BMI 45.48 kg/m   General:  alert, cooperative, and no distress  No rectal exam performed at this visit.     Assessment:    Anal fissure, resolving.    Plan:   I told him to continue following the recommendations of gastroenterology.  No need for surgical intervention at this time.  He does realize that this could recur.  I told him he was more than welcome to return to my care should his symptoms recur.  I did discuss avoidance of straining when moving his bowels, constipation, time on the toilet seat.  Follow-up here as needed.

## 2022-03-07 NOTE — Telephone Encounter (Signed)
See other chart note

## 2022-03-07 NOTE — Telephone Encounter (Signed)
PA done for Trulance 3 mg tab. Dx used: chronic anal fissure and IBS-C. Pt has tried/failed Linzess 72 mcg and Linzess 145 mcg. Pt has BCBC PPO so pt may have to pay out of pocket. Waiting on response from Cover My Meds.

## 2022-03-08 NOTE — Telephone Encounter (Signed)
George Gallagher,  Spoke with the pt again and he had his Employer to send over a PA form for [place] to do it this way. Paperwork filled out and sent back to the company. Awaiting the results to give to pt. Pt is aware of this.

## 2022-03-08 NOTE — Telephone Encounter (Signed)
FYI:   PA denied for Trulance 3 mg tab. Pt has been notified that his insurance does not have Rx benefits. Pt will speak with his employer about Rx benefits and I am leaving samples up front for the pt. The pt was notified that he will have to pay out of pocket.  Was given a discount card.

## 2022-03-15 NOTE — Telephone Encounter (Signed)
Dena, can you find out what's going on with this? I'm not sure what the status is of the PA or if it was denied.

## 2022-03-15 NOTE — Telephone Encounter (Signed)
Dr Marletta Lor,  Pt's wife just called back to see if you were given the message from yesterday. I advised that you had and that you would get back with the pt's as soon as he can. The pt's wife expressed understanding.

## 2022-03-15 NOTE — Telephone Encounter (Signed)
It was denied again. I will the insurance company once again because he has tried/failed more than one. I have downloaded the pt's formulary from the insurance company and will call the insurance company

## 2022-03-16 NOTE — Telephone Encounter (Signed)
Phoned and spoke to the pt to see if his insurance company has contacted him. He advised they sent him a letter stating certain things he needed to try. I looked @ the pt's last 2 ov notes and he was advised on one to stop Miralax. So I will call his insurance company tomorrow and try and get this approved for the pt. (Trulance 3 mg)

## 2022-03-21 NOTE — Telephone Encounter (Signed)
Phoned the PA number on back of the pt's insurance card and after being on the line with Elnita Maxwell for 17 minutes I was advised to call another number for PA's for [place] [employer] [place]. I asked her why this is not the correct number I called on pt's card. She couldn't answer it. I went online did a PA on [facility]. Waiting on a response.

## 2022-03-21 NOTE — Telephone Encounter (Signed)
Documentation from [employer] [place] approval for pt's approval for Trulance 3mg  tab. Coverage from March 21, 2022 through March 22, 2023 for 12 fill(s) of 30 unit(s) for each fill. Pt advised of this documentation and pt's pharmacy has been notified. Given to March 24, 2023 for scan in chart.

## 2024-03-31 ENCOUNTER — Emergency Department (HOSPITAL_COMMUNITY)

## 2024-03-31 ENCOUNTER — Encounter (HOSPITAL_COMMUNITY): Payer: Self-pay

## 2024-03-31 ENCOUNTER — Other Ambulatory Visit: Payer: Self-pay

## 2024-03-31 ENCOUNTER — Emergency Department (HOSPITAL_COMMUNITY)
Admission: EM | Admit: 2024-03-31 | Discharge: 2024-03-31 | Disposition: A | Discharge status: Home / Self Care | Attending: Emergency Medicine | Admitting: Emergency Medicine

## 2024-03-31 DIAGNOSIS — J069 Acute upper respiratory infection, unspecified: Secondary | ICD-10-CM | POA: Insufficient documentation

## 2024-03-31 DIAGNOSIS — R059 Cough, unspecified: Secondary | ICD-10-CM | POA: Diagnosis present

## 2024-03-31 LAB — RESP PANEL BY RT-PCR (RSV, FLU A&B, COVID)  RVPGX2
Influenza A by PCR: NEGATIVE
Influenza B by PCR: NEGATIVE
Resp Syncytial Virus by PCR: NEGATIVE
SARS Coronavirus 2 by RT PCR: NEGATIVE

## 2024-03-31 LAB — GROUP A STREP BY PCR: Group A Strep by PCR: NOT DETECTED

## 2024-03-31 MED ORDER — BENZONATATE 100 MG PO CAPS
200.0000 mg | ORAL_CAPSULE | Freq: Once | ORAL | Status: AC
  Administered 2024-03-31: 200 mg via ORAL
  Filled 2024-03-31: qty 2

## 2024-03-31 MED ORDER — DEXAMETHASONE SOD PHOSPHATE PF 10 MG/ML IJ SOLN
10.0000 mg | Freq: Once | INTRAMUSCULAR | Status: AC
  Administered 2024-03-31: 10 mg via INTRAMUSCULAR

## 2024-03-31 MED ORDER — PREDNISONE 20 MG PO TABS: 40.0000 mg | ORAL_TABLET | Freq: Every day | ORAL | 0 refills | Status: AC

## 2024-03-31 MED ORDER — BENZONATATE 200 MG PO CAPS: 200.0000 mg | ORAL_CAPSULE | Freq: Three times a day (TID) | ORAL | 0 refills | Status: AC | PRN

## 2024-03-31 MED ORDER — ALBUTEROL SULFATE HFA 108 (90 BASE) MCG/ACT IN AERS
2.0000 | INHALATION_SPRAY | Freq: Once | RESPIRATORY_TRACT | Status: AC
  Administered 2024-03-31: 2 via RESPIRATORY_TRACT
  Filled 2024-03-31: qty 6.7

## 2024-03-31 NOTE — ED Triage Notes (Signed)
 Pt c/o cough with clear mucous, nasal congestion, sore throat, rib pain from coughing since Nov 20th. Pt verbalized afebrile.

## 2024-03-31 NOTE — Discharge Instructions (Signed)
 Use your albuterol inhaler, 1 to 2 puffs every 4-6 hours as needed.  Start the prednisone  prescription tomorrow.  Tessalon to help with your cough you may also use over-the-counter nasal spray if needed.  Please follow-up with your primary care provider for recheck.

## 2024-04-01 NOTE — ED Provider Notes (Signed)
 [place] EMERGENCY DEPARTMENT AT [facility] Provider Note   CSN: [id number] Arrival date & time: 03/31/24  1035     Patient presents with: Cough and Sore Throat   George Gallagher is a 48 y.o. male.    Cough Associated symptoms: sore throat   Sore Throat       George Gallagher is a 48 y.o. male who presents to the Emergency Department complaining of nasal congestion, sore throat, bilateral rib pain secondary to coughing.  Symptoms present for over 1 week.  He states his cough is been productive of thick but clear sputum.  He endorses cough that worsens at night while lying down.  Bilateral rib pain began after excessive coughing.  He denies any shortness of breath or fever.  He has taken over-the-counter cold medications without relief.  Prior to Admission medications   Medication Sig Start Date End Date Taking? Authorizing Provider  benzonatate  (TESSALON ) 200 MG capsule Take 1 capsule (200 mg total) by mouth 3 (three) times daily as needed. Swallow whole, do not chew 03/31/24  Yes Heidie Krall, PA-C  predniSONE  (DELTASONE ) 20 MG tablet Take 2 tablets (40 mg total) by mouth daily. 03/31/24  Yes Kelleigh Skerritt, PA-C  atorvastatin (LIPITOR) 10 MG tablet Take 10 mg by mouth daily. 11/19/21   [provider]  Plecanatide  (TRULANCE ) 3 MG TABS Take by mouth.    [provider]  Plecanatide  (TRULANCE ) 3 MG TABS Take 1 tablet by mouth daily. With or without food 03/03/22   Shirlean Therisa ORN, NP    Allergies: Patient has no known allergies.    Review of Systems  HENT:  Positive for congestion and sore throat.   Respiratory:  Positive for cough.   Cardiovascular:        Bilateral rib pain  All other systems reviewed and are negative.   Updated Vital Signs BP 111/64   Pulse 78   Temp 98.2 F (36.8 C) (Oral)   Resp 17   Ht 5' 9 (1.753 m)   Wt (!) 144.7 kg   SpO2 98%   BMI 47.11 kg/m   Physical Exam Vitals and nursing note reviewed.   Constitutional:      Appearance: Normal appearance. He is not ill-appearing or toxic-appearing.  HENT:     Right Ear: Tympanic membrane and ear canal normal.     Left Ear: Tympanic membrane and ear canal normal.     Mouth/Throat:     Mouth: Mucous membranes are moist.     Pharynx: Posterior oropharyngeal erythema present.  Cardiovascular:     Rate and Rhythm: Normal rate and regular rhythm.     Pulses: Normal pulses.  Pulmonary:     Effort: Pulmonary effort is normal.     Breath sounds: Rhonchi present.     Comments: Slight scattered rhonchi.  No wheezing no increased work of breathing on exam Abdominal:     Palpations: Abdomen is soft.     Tenderness: There is no abdominal tenderness.  Musculoskeletal:        General: Normal range of motion.     Right lower leg: No edema.     Left lower leg: No edema.  Skin:    General: Skin is warm.     Capillary Refill: Capillary refill takes less than 2 seconds.  Neurological:     General: No focal deficit present.     Mental Status: He is alert.     Sensory: No sensory deficit.  Motor: No weakness.     (all labs ordered are listed, but only abnormal results are displayed) Labs Reviewed  GROUP A STREP BY PCR  RESP PANEL BY RT-PCR (RSV, FLU A&B, COVID)  [id number]    EKG: None  Radiology: [facility] Chest Port 1 View Result Date: 03/31/2024 EXAM: 1 VIEW(S) XRAY OF THE CHEST 03/31/2024 11:11:48 AM COMPARISON: [facility] chest radiograph dated 11/20/2014. CLINICAL HISTORY: Cough FINDINGS: LUNGS AND PLEURA: No focal pulmonary opacity. No pleural effusion. No pneumothorax. HEART AND MEDIASTINUM: No acute abnormality of the cardiac and mediastinal silhouettes. BONES AND SOFT TISSUES: No acute osseous abnormality. IMPRESSION: 1. No acute process. Electronically signed by: Evalene Coho MD 03/31/2024 11:26 AM EST RP Workstation: [id number]     Procedures   Medications Ordered in the ED  albuterol  (VENTOLIN  HFA) 108 (90 Base) MCG/ACT inhaler 2 puff  (2 puffs Inhalation Given 03/31/24 1205)  benzonatate  (TESSALON ) capsule 200 mg (200 mg Oral Given 03/31/24 1202)  dexamethasone  (DECADRON ) injection 10 mg (10 mg Intramuscular Given 03/31/24 1203)                                    Medical Decision Making   Patient here from home for evaluation of cough with URI symptoms.  Symptoms have been present for several days.  No reported fever.  He has bilateral rib pain that began after excessive coughing.  No exertional dyspnea He states his cough worsens when lying down  On my exam, patient is very well-appearing.  Nontoxic he is afebrile.  No hypoxia tachycardia or tachypnea.  I suspect this is viral process, pneumonia, PE, CHF, ACS also considered but felt less likely  Amount and/or Complexity of Data Reviewed Labs: ordered.    Details: Respiratory panel and strep screen negative Radiology: ordered.    Details: Chest x-ray without evidence of pneumonia Discussion of management or test interpretation with external provider(s):  Nontoxic-appearing patient here with cough and URI symptoms.  Was given albuterol  MDI here, will treat symptoms.  I doubt this is bacterial process.  Will dispense albuterol  MDI and prescription for steroids with Tessalon  for cough.  He will follow-up closely outpatient with PCP and he was given return precaution  Risk Prescription drug management.        Final diagnoses:  Viral URI with cough    ED Discharge Orders          Ordered    benzonatate  (TESSALON ) 200 MG capsule  3 times daily PRN        03/31/24 1340    predniSONE  (DELTASONE ) 20 MG tablet  Daily        03/31/24 1340               Herlinda Milling, PA-C 04/01/24 1436    Suzette Pac, MD 04/02/24 1027
